# Patient Record
Sex: Male | Born: 1990
Health system: Southern US, Community
[De-identification: ages and names within clinical notes are randomized; demographics above are authoritative.]

## PROBLEM LIST (undated history)

## (undated) DIAGNOSIS — Z8249 Family history of ischemic heart disease and other diseases of the circulatory system: Secondary | ICD-10-CM

## (undated) DIAGNOSIS — H3320 Serous retinal detachment, unspecified eye: Secondary | ICD-10-CM

## (undated) DIAGNOSIS — L729 Follicular cyst of the skin and subcutaneous tissue, unspecified: Secondary | ICD-10-CM

## (undated) DIAGNOSIS — Z8719 Personal history of other diseases of the digestive system: Secondary | ICD-10-CM

## (undated) DIAGNOSIS — T7840XA Allergy, unspecified, initial encounter: Secondary | ICD-10-CM

## (undated) DIAGNOSIS — J45909 Unspecified asthma, uncomplicated: Secondary | ICD-10-CM

## (undated) DIAGNOSIS — Z9889 Other specified postprocedural states: Secondary | ICD-10-CM

## (undated) HISTORY — DX: Allergy, unspecified, initial encounter: T78.40XA

## (undated) HISTORY — DX: Personal history of other diseases of the digestive system: Z87.19

## (undated) HISTORY — DX: Other specified postprocedural states: Z98.890

## (undated) HISTORY — DX: Follicular cyst of the skin and subcutaneous tissue, unspecified: L72.9

## (undated) HISTORY — DX: Unspecified asthma, uncomplicated: J45.909

## (undated) HISTORY — DX: Family history of ischemic heart disease and other diseases of the circulatory system: Z82.49

## (undated) HISTORY — DX: Serous retinal detachment, unspecified eye: H33.20

---
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
## 2011-01-04 ENCOUNTER — Inpatient Hospital Stay (INDEPENDENT_AMBULATORY_CARE_PROVIDER_SITE_OTHER)
Admission: RE | Admit: 2011-01-04 | Discharge: 2011-01-04 | Disposition: A | Discharge status: Home / Self Care | Payer: Self-pay | Source: Ambulatory Visit | Attending: Family Medicine | Admitting: Family Medicine

## 2011-01-04 DIAGNOSIS — L02419 Cutaneous abscess of limb, unspecified: Secondary | ICD-10-CM

## 2011-01-04 DIAGNOSIS — L03119 Cellulitis of unspecified part of limb: Secondary | ICD-10-CM

## 2011-03-13 ENCOUNTER — Inpatient Hospital Stay (INDEPENDENT_AMBULATORY_CARE_PROVIDER_SITE_OTHER)
Admission: RE | Admit: 2011-03-13 | Discharge: 2011-03-13 | Disposition: A | Discharge status: Home / Self Care | Payer: Self-pay | Source: Ambulatory Visit | Attending: Family Medicine | Admitting: Family Medicine

## 2011-03-13 DIAGNOSIS — J45909 Unspecified asthma, uncomplicated: Secondary | ICD-10-CM

## 2011-03-13 DIAGNOSIS — B2790 Infectious mononucleosis, unspecified without complication: Secondary | ICD-10-CM

## 2011-04-11 NOTE — Op Note (Signed)
NAME:  DAESEAN, LAZARZ              ACCOUNT NO.:  [id number]   MEDICAL RECORD NO.:  [id number]          PATIENT TYPE:  AMB   LOCATION:  SDS                          FACILITY:  [facility]   PHYSICIAN:  Lennie Muckle, MD      DATE OF BIRTH:  1991-08-20   DATE OF PROCEDURE:  06/09/2008  DATE OF DISCHARGE:                               OPERATIVE REPORT   PREOPERATIVE DIAGNOSIS:  Posterior scalp cyst.   POSTERIOR DIAGNOSIS:  Posterior scalp cyst.   PROCEDURE:  Excision of cyst on scalp.   SURGEON:  Lennie Muckle, MD.   ANESTHESIA:  General endotracheal anesthesia.   FINDINGS:  A small cyst-like lesion on the posterior scalp, sent to  pathology.   COMPLICATIONS:  No immediate complications.   ESTIMATED BLOOD LOSS:  Minimal amount of blood loss.   INDICATIONS FOR PROCEDURE:  Mr. Gautreau is a pleasant 20 year old male  who had had a swelling on his posterior scalp for the past year on and  off, has gotten larger and spontaneously ruptures.  He has had multiple  problems with this.  He did have discomfort when it is swollen and when  he is in the lying position.  Informed consent was obtained for excision  under IV sedation.  However, due to the area and difficulty of  monitoring airway, we decided to place him under general endotracheal  anesthesia.   DETAILS OF PROCEDURE:  Mr. Wiegman was identified in the preoperative  holding area.  He was given a gram of Kefzol and was taken to the  operating room.  Once in the operating room, placed in the supine  position.  He was placed under general endotracheal anesthesia and  placed under prone position.  The lesion was identified by me in the  preoperative holding area and I had shaved around the vicinity of the  lesion.  Then, his head was prepped and draped in usual sterile fashion.  A time-out procedure indicating patient and procedure was performed.  I  anesthetized the skin with 0.25% Marcaine with epinephrine.  I then  placed an incision  directly over the lesion using a #15 blade.  I  identified the area of concern just using a electrocautery.  I then  irrigated the tissues with saline.  There was no evidence of bleeding at  the end of the case.  I closed it in interrupted fashion using 3-0  Vicryl suture and then a 4-0 Monocryl to close the skin.  Dermabond was  placed for final dressing.  The patient was then  extubated and transported to postanesthetic care unit in stable  condition.  He will be discharged home, can shower tomorrow, will be  given over-the-counter Tylenol and ibuprofen for pain.  Followup with me  in approximately 2 or 3 weeks.      Lennie Muckle, MD  Electronically Signed     ALA/MEDQ  D:  06/09/2008  T:  06/10/2008  Job:  [id number]   cc:   Ernestina Penna, M.D.

## 2011-04-14 NOTE — Discharge Summary (Signed)
NAME:  Darrell Erickson, Darrell Erickson NO.:  [id number]   MEDICAL RECORD NO.:  [id number]          PATIENT TYPE:  OBV   LOCATION:  6148                         FACILITY:  [facility]   PHYSICIAN:  Leighton Roach McDiarmid, M.D.DATE OF BIRTH:  Jul 06, 1991   DATE OF ADMISSION:  04/27/2006  DATE OF DISCHARGE:  04/29/2006                                 DISCHARGE SUMMARY   DISCHARGE MEDICATIONS:  1.  Advair 250/50 one puff inhaled b.i.d.  2.  Albuterol MDI 2 puffs q.4-6 h p.r.n. asthma.  3.  Singulair 10 mg one tab p.o. daily.  4.  Zyrtec 50 mg one tab p.o. daily.  5.  Prednisone 20 mg two tablets p.o. daily for five days (start on April 30, 2006).   DISCHARGE DIAGNOSIS:  Asthma exacerbation.   BRIEF HISTORY OF ADMISSION/LABORATORY DATA:  A 20 year old African-American  male with history of asthma  (poor compliance to treatment/inadequate home  regimen) came to the emergency room at [facility] on April 27, 2006 with acute  asthma exacerbation.  The patient presented with increased work of  breathing, retractions, nasal flaring and O2 saturations on room air up to  the 70s.  During the past 3 hours and a half during the visit in the  emergency room, the patient received continuous albuterol nebs, terbutaline  subcutaneous x1, Solu-Medrol 125 mg IV x1.  The patient had a recent history  of fever with cough with whitish yellow sputum for 2-3 days.  Also, the  patient was complaining of sore throat intermittently.  The patient has no  sick contacts at home.  Last admission for asthma exacerbation was in the  1990s.   PHYSICAL EXAM:  On day of admission, pulse was 138, blood pressure 140s/80s,  respiration rate 24, O2 saturation 92% on 11 liters of oxygen nasal cannula,  temperature 98.2.  In general, the patient was  thin, mildly distressed,  conversant but uncooperative.  On respiratory exam, the patient presented  mild retractions throughout, good air movement, positive inspiration and  expiration wheezing bilaterally, left greater than right.   LABORATORY DATA ON ADMISSION:  WBC 10.9.  Electrolytes within normal limits.  A chest x-ray showed hyperinflation, increased peribronchial markings  bilaterally, patchy infiltrates left lower lobe, no atelectasis.   The patient was admitted for status asthmaticus probably secondary to viral  upper respiratory infection.  The patient was admitted to the PICU.   HOSPITAL COURSE:  First, status asthmaticus:  The patient was admitted to  the PICU.  The patient received treatment with continuous nebulization of  albuterol, Solu-Medrol, oxygen to keep his O2 saturation equal to 92% and  follow up with chest x-ray to rule out pneumonia.  The patient was also  placed on p.o. home medications (see discharge medications).  Throughout the  hospitalization, the patient became clinically stable.  The patient was  transferred to regular floor on April 28, 2006.  The patient was treated with  albuterol/Atrovent nebulizations every 4 hours.  O2 saturations were stable  95% on room air.  The day of discharge, the patient is playing  in the  playroom.  O2 saturations are 95-97% on room air.  The patient is now  requiring nebulization treatments p.r.n., and presented with normal  respiratory sounds with no wheezing, no increased work of breathing and  required albuterol every 4 hours.  Since the patient was clinically stable,  the patient was discharged home.  Changes to home medications were done to  Advair that was increased to 200/50.  The patient is to follow up with  primary physician at [facility] in 1-2 weeks. Prescription  was written for this patient.  Parents were explained to take Darrell Erickson to  emergency room/primary physician office if increased work of breathing,  increased shortness of breath, requiring albuterol more often than every 4  hours without good response.  The patient understands instructions.  This  patient  has, although, history of noncompliance with treatment.      Henri Medal, MD    ______________________________  Etta Grandchild, M.D.    FIM/MEDQ  D:  04/29/2006  T:  04/30/2006  Job:  [id number]   cc:   Olena Leatherwood [facility]

## 2011-08-24 LAB — CBC
Hemoglobin: 14.7
MCHC: 33.7
Platelets: 164
RBC: 5.22
RDW: 13.4
WBC: 6.3

## 2011-08-24 LAB — POCT RAPID STREP A: Streptococcus, Group A Screen (Direct): NEGATIVE

## 2014-06-04 ENCOUNTER — Ambulatory Visit (INDEPENDENT_AMBULATORY_CARE_PROVIDER_SITE_OTHER): Payer: BC Managed Care – PPO | Admitting: Family Medicine

## 2014-06-04 VITALS — BP 132/70 | HR 88 | Temp 97.7°F | Resp 16 | Ht 71.0 in | Wt 130.0 lb

## 2014-06-04 DIAGNOSIS — H109 Unspecified conjunctivitis: Secondary | ICD-10-CM

## 2014-06-04 MED ORDER — OLOPATADINE HCL 0.2 % OP SOLN: 1.0000 [drp] | Freq: Every day | OPHTHALMIC | Status: DC

## 2014-06-04 NOTE — Patient Instructions (Signed)

## 2014-06-04 NOTE — Progress Notes (Signed)
   Subjective:    Patient ID: Darrell Erickson, male    DOB: 1991/10/04, 23 y.o.   MRN: [id number]  HPI Patient presents today with 3 days of red, right eye. Clear drainage through the day. Has a similar episode last month and used his mother's drops with full resolution. 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  He uses his albuterol inhaler 2-3x month  PMH-  Asthma, premature birth PSH- None SH- No tobacco, no illicit drugs, no ETOH   Review of Systems No foreign body sensation. No pain. No change in vision.     Objective:   Physical Exam  Vitals reviewed. Constitutional: He is oriented to person, place, and time. He appears well-developed and well-nourished.  HENT:  Head: Normocephalic and atraumatic.  Right Ear: External ear normal.  Left Ear: External ear normal.  Nose: Nose normal.  Mouth/Throat: Oropharynx is clear and moist.  Eyes: Right eye exhibits no discharge. Left eye exhibits no discharge. No scleral icterus.  Right pupil irregularly shaped, reactive to light, contact lens in place. Right conjunctiva mildly injected. 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 No drainage, no erythema, no edema.   Cardiovascular: Normal rate, regular rhythm and normal heart sounds.   Pulmonary/Chest: Effort normal and breath sounds normal.  Musculoskeletal: Normal range of motion.  Neurological: He is alert and oriented to person, place, and time.  Skin: Skin is warm and dry.  Psychiatric: He has a normal mood and affect. His behavior is normal. Judgment and thought content normal.       Assessment & Plan:  1. Conjunctivitis of right eye - Olopatadine HCl 0.2 % SOLN; Apply 1 drop to eye daily.  Dispense: 2.5 mL; Refill: 0 -discussed diagnosis and provided written and verbal instructions.  -discussed s/s  bacterial infection and patient to RTC if he develops any of these.  Emi Belfasteborah B. Gessner, FNP-BC  [facility]  06/05/2014 7:25 AM

## 2015-01-25 ENCOUNTER — Ambulatory Visit (INDEPENDENT_AMBULATORY_CARE_PROVIDER_SITE_OTHER): Payer: 59 | Admitting: Family Medicine

## 2015-01-25 ENCOUNTER — Ambulatory Visit: Payer: Self-pay

## 2015-01-25 VITALS — BP 152/62 | HR 84 | Temp 97.2°F | Resp 17 | Ht 71.5 in | Wt 141.8 lb

## 2015-01-25 DIAGNOSIS — Z113 Encounter for screening for infections with a predominantly sexual mode of transmission: Secondary | ICD-10-CM

## 2015-01-25 DIAGNOSIS — J302 Other seasonal allergic rhinitis: Secondary | ICD-10-CM

## 2015-01-25 DIAGNOSIS — N508 Other specified disorders of male genital organs: Secondary | ICD-10-CM

## 2015-01-25 DIAGNOSIS — R030 Elevated blood-pressure reading, without diagnosis of hypertension: Secondary | ICD-10-CM

## 2015-01-25 DIAGNOSIS — N5089 Other specified disorders of the male genital organs: Secondary | ICD-10-CM

## 2015-01-25 DIAGNOSIS — B070 Plantar wart: Secondary | ICD-10-CM

## 2015-01-25 DIAGNOSIS — J309 Allergic rhinitis, unspecified: Secondary | ICD-10-CM

## 2015-01-25 DIAGNOSIS — R062 Wheezing: Secondary | ICD-10-CM

## 2015-01-25 DIAGNOSIS — J4540 Moderate persistent asthma, uncomplicated: Secondary | ICD-10-CM

## 2015-01-25 LAB — POCT CBC
Granulocyte percent: 59.3 %G (ref 37–80)
HEMATOCRIT: 46.6 % (ref 43.5–53.7)
Hemoglobin: 15.2 g/dL (ref 14.1–18.1)
LYMPH, POC: 3.2 (ref 0.6–3.4)
MCH: 29.4 pg (ref 27–31.2)
MCHC: 32.7 g/dL (ref 31.8–35.4)
MCV: 89.9 fL (ref 80–97)
MID (cbc): 0.3 (ref 0–0.9)
MPV: 9 fL (ref 0–99.8)
POC GRANULOCYTE: 5.1 (ref 2–6.9)
POC LYMPH %: 37.7 % (ref 10–50)
POC MID %: 3 %M (ref 0–12)
Platelet Count, POC: 129 10*3/uL — AB (ref 142–424)
RBC: 5.18 M/uL (ref 4.69–6.13)
RDW, POC: 13.5 %
WBC: 8.6 10*3/uL (ref 4.6–10.2)

## 2015-01-25 LAB — POCT URINALYSIS DIPSTICK
BILIRUBIN UA: NEGATIVE
Blood, UA: NEGATIVE
GLUCOSE UA: NEGATIVE
KETONES UA: NEGATIVE
LEUKOCYTES UA: NEGATIVE
NITRITE UA: NEGATIVE
PH UA: 6
Spec Grav, UA: 1.025
Urobilinogen, UA: 1

## 2015-01-25 MED ORDER — IMIQUIMOD 5 % EX CREA: TOPICAL_CREAM | CUTANEOUS | Status: DC

## 2015-01-25 MED ORDER — FLUTICASONE PROPIONATE 50 MCG/ACT NA SUSP: 2.0000 | Freq: Every day | NASAL | Status: DC

## 2015-01-25 MED ORDER — ALBUTEROL SULFATE HFA 108 (90 BASE) MCG/ACT IN AERS: 2.0000 | INHALATION_SPRAY | Freq: Four times a day (QID) | RESPIRATORY_TRACT | Status: DC | PRN

## 2015-01-25 MED ORDER — BUDESONIDE-FORMOTEROL FUMARATE 80-4.5 MCG/ACT IN AERO: 2.0000 | INHALATION_SPRAY | Freq: Two times a day (BID) | RESPIRATORY_TRACT | Status: DC

## 2015-01-25 MED ORDER — CETIRIZINE HCL 10 MG PO TABS: 10.0000 mg | ORAL_TABLET | Freq: Every day | ORAL | Status: DC

## 2015-01-25 NOTE — Progress Notes (Signed)
MRN: [id number] DOB: 01/25/1991  Subjective:   Darrell Erickson is a 24 y.o. male presenting for chief complaint of Foot Growth; Groin sensitivity; and Medication Refill  Medication refill - reports albuterol inhaler use every other day for mild shob, intermittent wheezing, is having a harder time with weather change and dog at home. Has 2-3 night time episodes of shob. Had to use nebulizer ~2 months ago, has used it 2-3 times this past year. Smokes marijuana weekly ~1x, denies smoking cigarettes, rare alcohol use. Admits history of seasonal allergies in the past, used to use Zyrtec in the past. Also used to use Symbicort but admits that he stopped taking this, has not really followed up with his medical care due to poor insurance covera.   Foot growth - reports 2 month history of new onset lesion on plantar sole of his right foot. Lesion is painful, stingy, worse with stepping, bearing weight on right leg, exercise. Denies fevers, erythema, drainage, bleeding, edema. Patient is active, on his feet a lot, does MMA training.  Groin issue - reports 2 month history of new onset mass over left testicle. States that there is also another over right testicle. Found this incidentally while showering. Denies testicluar pain, genital rashes, fevers, penile discharge, hematuria, dysuria, pelvic pain, abdominal pain, n/v. Denies history of cryptorchidism. Denies history of sexually transmitted infection, is currently sexually active, always uses condom.  Denies any other aggravating or relieving factors, no other questions or concerns.  Darrell Erickson has a current medication list which includes the following prescription(s): albuterol. He is allergic to eggs or egg-derived products and shellfish allergy.  Darrell Erickson  has a past medical history of Asthma; Allergy; H/O hernia repair; Family history of heart murmur; Scalp cyst (2006); Retinal detachment; and Premature birth. Also  has past surgical history that  includes Cystectomy (2006).  ROS As in subjective.  Objective:   Vitals: BP 152/62 mmHg  Pulse 84  Temp(Src) 97.2 F (36.2 C) (Oral)  Resp 17  Ht 5' 11.5" (1.816 m)  Wt 141 lb 12.8 oz (64.32 kg)  BMI 19.50 kg/m2  SpO2 99%  Physical Exam  Constitutional: He is oriented to person, place, and time and well-developed, well-nourished, and in no distress.  HENT:  TM's intact bilaterally, no effusions or erythema. Nasal turbinates boggy and edematous R>L, clear-yellow rhinorrhea. Slight postnasal drip present, without oropharyngeal exudates or tonsillar edema or erythema.  Eyes: Conjunctivae are normal. Right eye exhibits no discharge. Left eye exhibits no discharge. No scleral icterus.  Cardiovascular: Normal rate, regular rhythm and intact distal pulses.  Exam reveals no gallop and no friction rub.   No murmur heard. Pulmonary/Chest: Effort normal. No stridor. No respiratory distress. He has wheezes (expiratory, diffuse over right side, also present in left mid lobe). He has no rales. He exhibits no tenderness.  Abdominal: Bowel sounds are normal.  Genitourinary: He exhibits abnormal scrotal mass (bilateral, mobile, over epidydimal area). He exhibits no abnormal testicular mass, no testicular tenderness, no scrotal tenderness and no epididymal tenderness. Cremasteric reflex is present. Penis exhibits no lesions and no edema. No discharge found.  Lymphadenopathy:    He has no cervical adenopathy.  Neurological: He is alert and oriented to person, place, and time.  Skin: Skin is warm and dry. Lesion (~2cm hyperkeratotic area over mid plantar surface of right foot with ~2 black dots centrally) noted. No abrasion, no bruising, no ecchymosis, no laceration, no petechiae and no rash noted. No erythema.  Psychiatric: Mood and  affect normal.   Results for orders placed or performed in visit on 01/25/15 (from the past 24 hour(s))  POCT urinalysis dipstick     Status: None   Collection Time:  01/25/15  3:27 PM  Result Value Ref Range   Color, UA amber    Clarity, UA clear    Glucose, UA neg    Bilirubin, UA neg    Ketones, UA neg    Spec Grav, UA 1.025    Blood, UA neg    pH, UA 6.0    Protein, UA trace    Urobilinogen, UA 1.0    Nitrite, UA neg    Leukocytes, UA Negative   POCT CBC     Status: Abnormal   Collection Time: 01/25/15  3:28 PM  Result Value Ref Range   WBC 8.6 4.6 - 10.2 K/uL   Lymph, poc 3.2 0.6 - 3.4   POC LYMPH PERCENT 37.7 10 - 50 %L   MID (cbc) 0.3 0 - 0.9   POC MID % 3.0 0 - 12 %M   POC Granulocyte 5.1 2 - 6.9   Granulocyte percent 59.3 37 - 80 %G   RBC 5.18 4.69 - 6.13 M/uL   Hemoglobin 15.2 14.1 - 18.1 g/dL   HCT, POC 16.1 09.6 - 53.7 %   MCV 89.9 80 - 97 fL   MCH, POC 29.4 27 - 31.2 pg   MCHC 32.7 31.8 - 35.4 g/dL   RDW, POC 04.5 %   Platelet Count, POC 129 (A) 142 - 424 K/uL   MPV 9.0 0 - 99.8 fL   Assessment and Plan :   1. Extrinsic asthma, moderate persistent, uncomplicated 2. Expiratory wheezing - Advised to start Symbicort for better asthma control, continue albuterol use as needed - Follow up in 4 weeks  3. Seasonal allergies 4. Allergic rhinitis, unspecified allergic rhinitis type - Start Flonase and Zyrtec for control of allergies - Advised to hydrate well, avoid allergens as much as possible - Follow up as above  5. Screen for sexually transmitted diseases 6. Scrotal masses - Labs pending, referral for scrotal US - Low risk for malignancy, may be normal finding (epidydimis given physical exam findings)  7. Plantar wart of right foot - Started treatment with liquid nitrogen today, patient tolerated this procedure well - Apply Imiquimod cream 3 times a week - Follow up in 4 weeks  8. Elevated blood pressure reading (not hypertension) - Likely due to dehydration, patient admitted that he did not drink water all day as he normally does - Provided water while at the clinic, advised regular and adequate hydration - Will  recheck BP in 1 month  Darrell Bamberg, PA-C [facility] [phone] 01/25/2015 3:48 PM

## 2015-01-25 NOTE — Patient Instructions (Addendum)
Please return to the walk in clinic in 4 weeks for follow up on your asthma and plantar wart.  Asthma - start symbicort twice a day for 1 month, continue to use albuterol as needed but not less than 6 hours between each use. Allergies - please start Flonase and Zyrtec daily for better allergy control. Wart - make sure you remember to apply Imiquimod 3x a week, every other day.    Plantar Warts Warts are benign (noncancerous) growths of the outer skin layer. They can occur at any time in life but are most common during childhood and the teen years. Warts can occur on many skin surfaces of the body. When they occur on the underside (sole) of your foot they are called plantar warts. They often emerge in groups with several small warts encircling a larger growth. CAUSES  Human papillomavirus (HPV) is the cause of plantar warts. HPV attacks a break in the skin of the foot. Walking barefoot can lead to exposure to the wart virus. Plantar warts tend to develop over areas of pressure such as the heel and ball of the foot. Plantar warts often grow into the deeper layers of skin. They may spread to other areas of the sole but cannot spread to other areas of the body. SYMPTOMS  You may also notice a growth on the undersurface of your foot. The wart may grow directly into the sole of the foot, or rise above the surface of the skin on the sole of the foot, or both. They are most often flat from pressure. Warts generally do not cause itching but may cause pain in the area of the wart when you put weight on your foot. DIAGNOSIS  Diagnosis is made by physical examination. This means your caregiver discovers it while examining your foot.  TREATMENT  There are many ways to treat plantar warts. However, warts are very tough. Sometimes it is difficult to treat them so that they go away completely and do not grow back. Any treatment must be done regularly to work. If left untreated, most plantar warts will eventually  disappear over a period of one to two years. Treatments you can do at home include:  Putting duct tape over the top of the wart (occlusion) has been found to be effective over several months. The duct tape should be removed each night and reapplied until the wart has disappeared.  Placing over-the-counter medications on top of the wart to help kill the wart virus and remove the wart tissue (salicylic acid, cantharidin, and dichloroacetic acid) are useful. These are called keratolytic agents. These medications make the skin soft and gradually layers will shed away. These compounds are usually placed on the wart each night and then covered with a bandage. They are also available in premedicated bandage form. Avoid surrounding skin when applying these liquids as these medications can burn healthy skin. The treatment may take several months of nightly use to be effective.  Cryotherapy to freeze the wart has recently become available over-the-counter for children 4 years and older. This system makes use of a soft narrow applicator connected to a bottle of compressed cold liquid that is applied directly to the wart. This medication can burn healthy skin and should be used with caution.  As with all over-the-counter medications, read the directions carefully before use. Treatments generally done in your caregiver's office include:  Some aggressive treatments may cause discomfort, discoloration, and scarring of the surrounding skin. The risks and benefits of treatment should  be discussed with your caregiver.  Freezing the wart with liquid nitrogen (cryotherapy, see above).  Burning the wart with use of very high heat (cautery).  Injecting medication into the wart.  Surgically removing or laser treatment of the wart.  Your caregiver may refer you to a dermatologist for difficult to treat large-sized warts or large numbers of warts. HOME CARE INSTRUCTIONS   Soak the affected area in warm water. Dry  the area completely when you are done. Remove the top layer of softened skin, then apply the chosen topical medication and reapply a bandage.  Remove the bandage daily and file excess wart tissue (pumice stone works well for this purpose). Repeat the entire process daily or every other day for weeks until the plantar wart disappears.  Several brands of salicylic acid pads are available as over-the-counter remedies.  Pain can be relieved by wearing a donut bandage. This is a bandage with a hole in it. The bandage is put on with the hole over the wart. This helps take the pressure off the wart and gives pain relief. To help prevent plantar warts:  Wear shoes and socks and change them daily.  Keep feet clean and dry.  Check your feet and your children's feet regularly.  Avoid direct contact with warts on other people.  Have growths or changes on your skin checked by your caregiver. Document Released: 02/03/2004 Document Revised: 03/30/2014 Document Reviewed: 07/14/2009 [facility] Patient Information 2015 [place] [place]. This information is not intended to replace advice given to you by your health care provider. Make sure you discuss any questions you have with your health care provider.    Asthma Asthma is a recurring condition in which the airways tighten and narrow. Asthma can make it difficult to breathe. It can cause coughing, wheezing, and shortness of breath. Asthma episodes, also called asthma attacks, range from minor to life-threatening. Asthma cannot be cured, but medicines and lifestyle changes can help control it. CAUSES Asthma is believed to be caused by inherited (genetic) and environmental factors, but its exact cause is unknown. Asthma may be triggered by allergens, lung infections, or irritants in the air. Asthma triggers are different for each person. Common triggers include:   Animal dander.  Dust mites.  Cockroaches.  Pollen from trees or  grass.  Mold.  Smoke.  Air pollutants such as dust, household cleaners, hair sprays, aerosol sprays, paint fumes, strong chemicals, or strong odors.  Cold air, weather changes, and winds (which increase molds and pollens in the air).  Strong emotional expressions such as crying or laughing hard.  Stress.  Certain medicines (such as aspirin) or types of drugs (such as beta-blockers).  Sulfites in foods and drinks. Foods and drinks that may contain sulfites include dried fruit, potato chips, and sparkling grape juice.  Infections or inflammatory conditions such as the flu, a cold, or an inflammation of the nasal membranes (rhinitis).  Gastroesophageal reflux disease (GERD).  Exercise or strenuous activity. SYMPTOMS Symptoms may occur immediately after asthma is triggered or many hours later. Symptoms include:  Wheezing.  Excessive nighttime or early morning coughing.  Frequent or severe coughing with a common cold.  Chest tightness.  Shortness of breath. DIAGNOSIS  The diagnosis of asthma is made by a review of your medical history and a physical exam. Tests may also be performed. These may include:  Lung function studies. These tests show how much air you breathe in and out.  Allergy tests.  Imaging tests such as X-rays. TREATMENT  Asthma cannot be cured, but it can usually be controlled. Treatment involves identifying and avoiding your asthma triggers. It also involves medicines. There are 2 classes of medicine used for asthma treatment:   Controller medicines. These prevent asthma symptoms from occurring. They are usually taken every day.  Reliever or rescue medicines. These quickly relieve asthma symptoms. They are used as needed and provide short-term relief. Your health care provider will help you create an asthma action plan. An asthma action plan is a written plan for managing and treating your asthma attacks. It includes a list of your asthma triggers and how  they may be avoided. It also includes information on when medicines should be taken and when their dosage should be changed. An action plan may also involve the use of a device called a peak flow meter. A peak flow meter measures how well the lungs are working. It helps you monitor your condition. HOME CARE INSTRUCTIONS   Take medicines only as directed by your health care provider. Speak with your health care provider if you have questions about how or when to take the medicines.  Use a peak flow meter as directed by your health care provider. Record and keep track of readings.  Understand and use the action plan to help minimize or stop an asthma attack without needing to seek medical care.  Control your home environment in the following ways to help prevent asthma attacks:  Do not smoke. Avoid being exposed to secondhand smoke.  Change your heating and air conditioning filter regularly.  Limit your use of fireplaces and wood stoves.  Get rid of pests (such as roaches and mice) and their droppings.  Throw away plants if you see mold on them.  Clean your floors and dust regularly. Use unscented cleaning products.  Try to have someone else vacuum for you regularly. Stay out of rooms while they are being vacuumed and for a short while afterward. If you vacuum, use a dust mask from a hardware store, a double-layered or microfilter vacuum cleaner bag, or a vacuum cleaner with a HEPA filter.  Replace carpet with wood, tile, or vinyl flooring. Carpet can trap dander and dust.  Use allergy-proof pillows, mattress covers, and box spring covers.  Wash bed sheets and blankets every week in hot water and dry them in a dryer.  Use blankets that are made of polyester or cotton.  Clean bathrooms and kitchens with bleach. If possible, have someone repaint the walls in these rooms with mold-resistant paint. Keep out of the rooms that are being cleaned and painted.  Wash hands frequently. SEEK  MEDICAL CARE IF:   You have wheezing, shortness of breath, or a cough even if taking medicine to prevent attacks.  The colored mucus you cough up (sputum) is thicker than usual.  Your sputum changes from clear or white to yellow, green, gray, or bloody.  You have any problems that may be related to the medicines you are taking (such as a rash, itching, swelling, or trouble breathing).  You are using a reliever medicine more than 2-3 times per week.  Your peak flow is still at 50-79% of your personal best after following your action plan for 1 hour.  You have a fever. SEEK IMMEDIATE MEDICAL CARE IF:   You seem to be getting worse and are unresponsive to treatment during an asthma attack.  You are short of breath even at rest.  You get short of breath when doing very little physical activity.  You have difficulty eating, drinking, or talking due to asthma symptoms.  You develop chest pain.  You develop a fast heartbeat.  You have a bluish color to your lips or fingernails.  You are light-headed, dizzy, or faint.  Your peak flow is less than 50% of your personal best. MAKE SURE YOU:   Understand these instructions.  Will watch your condition.  Will get help right away if you are not doing well or get worse. Document Released: 11/13/2005 Document Revised: 03/30/2014 Document Reviewed: 06/12/2013 [facility] Patient Information 2015 [place], [place]. This information is not intended to replace advice given to you by your health care provider. Make sure you discuss any questions you have with your health care provider.

## 2015-01-26 LAB — GC/CHLAMYDIA PROBE AMP
CT PROBE, AMP APTIMA: NEGATIVE
GC PROBE AMP APTIMA: NEGATIVE

## 2015-02-02 ENCOUNTER — Other Ambulatory Visit: Payer: Self-pay

## 2015-02-03 ENCOUNTER — Other Ambulatory Visit: Payer: Self-pay

## 2015-04-05 ENCOUNTER — Ambulatory Visit (INDEPENDENT_AMBULATORY_CARE_PROVIDER_SITE_OTHER): Payer: 59 | Admitting: Emergency Medicine

## 2015-04-05 VITALS — BP 132/82 | HR 70 | Temp 97.7°F | Resp 20 | Ht 71.5 in | Wt 136.6 lb

## 2015-04-05 DIAGNOSIS — J309 Allergic rhinitis, unspecified: Secondary | ICD-10-CM

## 2015-04-05 DIAGNOSIS — Z2089 Contact with and (suspected) exposure to other communicable diseases: Secondary | ICD-10-CM

## 2015-04-05 DIAGNOSIS — J4540 Moderate persistent asthma, uncomplicated: Secondary | ICD-10-CM

## 2015-04-05 DIAGNOSIS — Z207 Contact with and (suspected) exposure to pediculosis, acariasis and other infestations: Secondary | ICD-10-CM

## 2015-04-05 MED ORDER — CETIRIZINE HCL 10 MG PO TABS: 10.0000 mg | ORAL_TABLET | Freq: Every day | ORAL | Status: DC

## 2015-04-05 MED ORDER — FLUTICASONE PROPIONATE 50 MCG/ACT NA SUSP: 2.0000 | Freq: Every day | NASAL | Status: DC

## 2015-04-05 MED ORDER — MONTELUKAST SODIUM 10 MG PO TABS: 10.0000 mg | ORAL_TABLET | Freq: Every day | ORAL | Status: DC

## 2015-04-05 MED ORDER — PERMETHRIN 5 % EX CREA: 1.0000 "application " | TOPICAL_CREAM | Freq: Once | CUTANEOUS | Status: DC

## 2015-04-05 MED ORDER — BUDESONIDE-FORMOTEROL FUMARATE 160-4.5 MCG/ACT IN AERO: 2.0000 | INHALATION_SPRAY | Freq: Two times a day (BID) | RESPIRATORY_TRACT | Status: DC

## 2015-04-05 MED ORDER — ALBUTEROL SULFATE HFA 108 (90 BASE) MCG/ACT IN AERS: 2.0000 | INHALATION_SPRAY | RESPIRATORY_TRACT | Status: DC | PRN

## 2015-04-05 NOTE — Patient Instructions (Signed)

## 2015-04-05 NOTE — Progress Notes (Signed)
[facility] [address], [place] [address] [phone]  Date:  04/05/2015   Name:  Darrell Erickson   DOB:  22-Jul-1991   MRN:  [id number]  PCP:  No PCP Per Patient    Chief Complaint: Asthma; possible scabies; and paper work   History of Present Illness:  Darrell Erickson is a 24 y.o. very pleasant male patient who presents with the following:  Patient has a long term history of asthma.  Was on symbicort but ran out. Now using MDI frequently and daily.  Says it is interfering with work due need to use his MDI and break from work No fever, chills, sputum production Needs refills on flonase On singulair as child and found it useful Concerned he may have been exposed to scabies Has a few pruritic lesions on arms Denies other complaint or health concern today.   There are no active problems to display for this patient.   Past Medical History  Diagnosis Date  . Asthma   . Allergy   . H/O hernia repair     As newborn  . Family history of heart murmur     childhood  . Scalp cyst 2006  . Retinal detachment     as a newborn  . Premature birth     Was born at ~24 weeks    Past Surgical History  Procedure Laterality Date  . Cystectomy  2006    scalp    History  Substance Use Topics  . Smoking status: Never Smoker   . Smokeless tobacco: Not on file  . Alcohol Use: Not on file    Family History  Problem Relation Age of Onset  . Bipolar disorder Mother   . Schizophrenia Mother   . Lupus Father   . Hypertension Paternal Uncle   . Hypertension Paternal Uncle     Allergies  Allergen Reactions  . Eggs Or Egg-Derived Products   . Shellfish Allergy Itching    Medication list has been reviewed and updated.  Current Outpatient Prescriptions on File Prior to Visit  Medication Sig Dispense Refill  . budesonide-formoterol (SYMBICORT) 80-4.5 MCG/ACT inhaler Inhale 2 puffs into the lungs 2 (two) times daily. 1 Inhaler 6  . cetirizine (ZYRTEC)  10 MG tablet Take 1 tablet (10 mg total) by mouth daily. 30 tablet 11  . fluticasone (FLONASE) 50 MCG/ACT nasal spray Place 2 sprays into both nostrils daily. 16 g 11  . imiquimod (ALDARA) 5 % cream Apply topically 3 (three) times a week. 12 each 1   No current facility-administered medications on file prior to visit.    Review of Systems:  Review of Systems  Constitutional: Negative for fever, chills and fatigue.  HENT: Negative for congestion, ear pain, hearing loss, postnasal drip, rhinorrhea and sinus pressure.   Eyes: Negative for discharge and redness.  Respiratory: Negative for cough, shortness of breath and wheezing.   Cardiovascular: Negative for chest pain and leg swelling.  Gastrointestinal: Negative for nausea, vomiting, abdominal pain, constipation and blood in stool.  Genitourinary: Negative for dysuria, urgency and frequency.  Musculoskeletal: Negative for neck stiffness.  Skin: Negative for rash.  Neurological: Negative for seizures, weakness and headaches.   Physical Examination: Filed Vitals:   04/05/15 0932  BP: 132/82  Pulse: 70  Temp: 97.7 F (36.5 C)  Resp: 20   Filed Vitals:   04/05/15 0932  Height: 5' 11.5" (1.816 m)  Weight: 136 lb 9.6 oz (61.961 kg)  Body mass index is 18.79 kg/(m^2). Ideal Body Weight: Weight in (lb) to have BMI = 25: 181.4  GEN: WDWN, NAD, Non-toxic, A & O x 3 HEENT: Atraumatic, Normocephalic. Neck supple. No masses, No LAD. Ears and Nose: No external deformity. CV: RRR, No M/G/R. No JVD. No thrill. No extra heart sounds. PULM: CTA B, no wheezes, crackles, rhonchi. No retractions. No resp. distress. No accessory muscle use. ABD: S, NT, ND, +BS. No rebound. No HSM. EXTR: No c/c/e NEURO Normal gait.  PSYCH: Normally interactive. Conversant. Not depressed or anxious appearing.  Calm demeanor.    Assessment and Plan: 1. Extrinsic asthma, moderate persistent, uncomplicated Add symbicort and singulair - albuterol (PROVENTIL  HFA;VENTOLIN HFA) 108 (90 BASE) MCG/ACT inhaler; Inhale 2 puffs into the lungs every 4 (four) hours as needed for wheezing or shortness of breath (cough, shortness of breath or wheezing.).  Dispense: 1 Inhaler; Refill: 12  2. Expiratory wheezing Add meds - albuterol (PROVENTIL HFA;VENTOLIN HFA) 108 (90 BASE) MCG/ACT inhaler; Inhale 2 puffs into the lungs every 4 (four) hours as needed for wheezing or shortness of breath (cough, shortness of breath or wheezing.).  Dispense: 1 Inhaler; Refill: 12  3. Scabies   Elimite  Signed Phillips OdorJeffery Tayt Moyers, MD

## 2015-04-12 ENCOUNTER — Telehealth: Payer: Self-pay

## 2015-04-12 NOTE — Telephone Encounter (Signed)
Found PE forms in the scanned box, [address] make a copy and [facility] [address] Called patient to let them know.

## 2015-04-12 NOTE — Telephone Encounter (Signed)
Patient is requesting copy of his [employer] PE forms to be mailed [address],  [place], [place]  [id number]  Please send via mail - needs by the 23rd.   [phone]

## 2015-09-20 NOTE — Telephone Encounter (Signed)
No msg °

## 2015-09-29 ENCOUNTER — Encounter: Payer: Self-pay | Admitting: Family Medicine

## 2015-09-29 ENCOUNTER — Ambulatory Visit (INDEPENDENT_AMBULATORY_CARE_PROVIDER_SITE_OTHER): Payer: 59 | Admitting: Family Medicine

## 2015-09-29 ENCOUNTER — Telehealth: Payer: Self-pay | Admitting: Family Medicine

## 2015-09-29 VITALS — BP 133/74 | HR 89 | Temp 98.0°F | Resp 16 | Ht 71.5 in | Wt 132.6 lb

## 2015-09-29 DIAGNOSIS — Z91018 Allergy to other foods: Secondary | ICD-10-CM | POA: Diagnosis not present

## 2015-09-29 DIAGNOSIS — B070 Plantar wart: Secondary | ICD-10-CM | POA: Diagnosis not present

## 2015-09-29 DIAGNOSIS — J4540 Moderate persistent asthma, uncomplicated: Secondary | ICD-10-CM | POA: Diagnosis not present

## 2015-09-29 MED ORDER — BUDESONIDE-FORMOTEROL FUMARATE 80-4.5 MCG/ACT IN AERO: 2.0000 | INHALATION_SPRAY | Freq: Two times a day (BID) | RESPIRATORY_TRACT | Status: DC

## 2015-09-29 NOTE — Progress Notes (Signed)
Subjective:    Patient ID: Darrell Erickson, male    DOB: Dec 05, 1990, 24 y.o.   MRN: [id number]  HPI This is a pleasant [age] male who presents today for follow up of asthma and plantar wart.   Asthma- He was taking symbicort with good results but ran out. He had a difficult time in the summer with increased wheezing and chest tightness and was using albuterol inhaler multiple times a day. Symptoms are better now, he is using albuterol every 2 days. He has dogs who sleep with him, and tries to wash bedding frequently, but is not willing to not let his dogs sleep with him. 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 He has had more difficulty with his breathing lately which affects his ability to do his job. He needs a break every couple of hours. He would like a form completed today allowing time to use his inhaler and recover as needed. He had a similar form completed in the spring. He has a history of food allergies and would like referral to allergy for updated testing.   He has a recurrent right foot plantar wart. He has not used anything on it recently. Has had cryotherapy x 1. He has manually debrided it himself. It is very painful and he is concerned about spreading the virus when he does martial arts.   Past Medical History  Diagnosis Date  . Asthma   . Allergy   . H/O hernia repair     As newborn  . Family history of heart murmur     childhood  . Scalp cyst 2006  . Retinal detachment     as a newborn  . Premature birth     Was born at ~24 weeks   Past Surgical History  Procedure Laterality Date  . Cystectomy  2006    scalp   Family History  Problem Relation Age of Onset  . Bipolar disorder Mother   . Schizophrenia Mother   . Lupus Father   . Hypertension Paternal Uncle   . Hypertension Paternal Uncle    Social History  Substance Use Topics  . Smoking status: Never Smoker   . Smokeless tobacco: None  . Alcohol Use: None   Review  of Systems No fever or chills, no sputum production and per HPI    Objective:   Physical Exam Physical Exam  Constitutional: Oriented to person, place, and time. He appears well-developed and well-nourished.  HENT:  Head: Normocephalic and atraumatic.  Eyes: Conjunctivae are normal.  Neck: Normal range of motion. Neck supple.  Cardiovascular: Normal rate, regular rhythm and normal heart sounds.  Pulmonary/Chest: Effort normal and breath sounds normal.  Musculoskeletal: Normal range of motion.  Neurological: Alert and oriented to person, place, and time.  Skin: Skin is warm and dry. Ball of right foot with approximately 1.5 cm thickened, raised area with small black flecks. Little callous build up.  Psychiatric: Normal mood and affect. Behavior is normal. Judgment and thought content normal.  Vitals reviewed. BP 133/74 mmHg  Pulse 89  Temp(Src) 98 F (36.7 C) (Oral)  Resp 16  Ht 5' 11.5" (1.816 m)  Wt 132 lb 9.6 oz (60.147 kg)  BMI 18.24 kg/m2     Assessment & Plan:  1. Extrinsic asthma, moderate persistent, uncomplicated - has long discussion with patient regarding disease process and importance of controlling inflammation.  - budesonide-formoterol (SYMBICORT) 80-4.5 MCG/ACT inhaler; Inhale 2 puffs into  the lungs 2 (two) times daily.  Dispense: 1 Inhaler; Refill: 6  2. Multiple food allergies - Ambulatory referral to Allergy  3. Plantar wart of right foot - Discussed treatment options- cryotherapy vs. Salicylic acid. Due to size and location, concerned about effect of cryotherapy on his ability to walk (he doesn't drive). He will try wart stick and covering until next visit when we will re-evaluate.   - follow up in 2-3 months, sooner if worsening of symptoms.    Olean Ree, FNP-BC  [facility], [facility]  10/01/2015 6:50 PM

## 2015-09-29 NOTE — Telephone Encounter (Signed)
Requested paperwork completed and mailed to patient. Copy kept for scanning.

## 2015-09-29 NOTE — Patient Instructions (Signed)
Get a wart stick Take your medication every day Get some more zytrect (allergies)- generic fine

## 2015-10-25 ENCOUNTER — Telehealth: Payer: Self-pay

## 2015-10-25 NOTE — Telephone Encounter (Signed)
Pt states his job accomodation note needs to be more speciific,it should say he is allowed 30-45 minutues extra for break time??   Best phone for pt is [phone]

## 2015-10-26 NOTE — Telephone Encounter (Signed)
The paperwork has been sent for scanning, so I don't have it to review. If I remember correctly I specified that he could have a 15 minute break for inhaler use. I think 30-45 minutes is an excessive amount to use his inhaler. He should be seeing improvement with consistent use of his symbicort. It is important that he follow up as discussed.

## 2015-10-27 NOTE — Telephone Encounter (Signed)
Spoke with pt, he needs the form to specially say 30-45 minutes per day needed in addition to breaks and lunch. He states he is feeling a little better from the Symbicort and I reminded him to follow up accordingly. Pt agreed. He is going to fax me an addendum to the form since the original went to scanning. Thanks Debbie.

## 2015-11-09 NOTE — Telephone Encounter (Signed)
Forms completed and given to [facility] Erickson.

## 2016-02-01 ENCOUNTER — Ambulatory Visit (INDEPENDENT_AMBULATORY_CARE_PROVIDER_SITE_OTHER): Payer: 59 | Admitting: Family Medicine

## 2016-02-01 VITALS — BP 132/88 | HR 74 | Temp 97.8°F | Resp 16 | Ht 72.0 in | Wt 140.0 lb

## 2016-02-01 DIAGNOSIS — F329 Major depressive disorder, single episode, unspecified: Secondary | ICD-10-CM | POA: Diagnosis not present

## 2016-02-01 DIAGNOSIS — J069 Acute upper respiratory infection, unspecified: Secondary | ICD-10-CM

## 2016-02-01 DIAGNOSIS — R4589 Other symptoms and signs involving emotional state: Secondary | ICD-10-CM

## 2016-02-01 NOTE — Progress Notes (Signed)
Subjective:    Patient ID: Darrell Erickson, male    DOB: [birth year], 25 y.o.   MRN: [id number]  HPI This is a pleasant 25 yo male who presents today with 1 week of muscle aches, sweats, headache, sore throat, occasional productive cough (white).  He has a history of asthma and reports compliance with medications until he ran out two days ago. He is picking up his refills today. Feels better today. No OTC medication. Smokes marijuana occasionally.   Has a family history of mental health issues (mother with bi-polar, schizophrenia). He has noticed more intermittent sadness over the last 6 months. Has a lot of stress with full time work, school, bills. Feels better when he exercises. Is working on getting more sleep. He is interested in talking to a therapist. Denies suicidal ideations. He is Buddhist and meditates twice a day.   Past Medical History  Diagnosis Date  . Asthma   . Allergy   . H/O hernia repair     As newborn  . Family history of heart murmur     childhood  . Scalp cyst 2006  . Retinal detachment     as a newborn  . Premature birth     Was born at ~24 weeks   Past Surgical History  Procedure Laterality Date  . Cystectomy  2006    scalp   Family History  Problem Relation Age of Onset  . Bipolar disorder Mother   . Schizophrenia Mother   . Lupus Father   . Hypertension Paternal Uncle   . Hypertension Paternal Uncle    Social History  Substance Use Topics  . Smoking status: Never Smoker   . Smokeless tobacco: None  . Alcohol Use: None    Review of Systems  Constitutional: Positive for fever and fatigue.  HENT: Positive for rhinorrhea.   Respiratory: Positive for cough. Negative for shortness of breath and wheezing.   Neurological: Positive for headaches.  Psychiatric/Behavioral: Positive for sleep disturbance (difficulty falling asleep) and dysphoric mood. Negative for suicidal ideas and self-injury.       Objective:   Physical Exam  Constitutional:  He is oriented to person, place, and time. He appears well-developed and well-nourished. No distress.  HENT:  Head: Normocephalic and atraumatic.  Right Ear: Tympanic membrane, external ear and ear canal normal.  Left Ear: Tympanic membrane, external ear and ear canal normal.  Nose: Rhinorrhea present.  Mouth/Throat: Oropharynx is clear and moist.  Eyes: Conjunctivae are normal.  Neck: Normal range of motion. Neck supple.  Cardiovascular: Normal rate, regular rhythm and normal heart sounds.   Pulmonary/Chest: Effort normal and breath sounds normal.  Musculoskeletal: Normal range of motion.  Neurological: He is alert and oriented to person, place, and time.  Skin: Skin is warm and dry. He is not diaphoretic.  Psychiatric: He has a normal mood and affect. His behavior is normal. Judgment and thought content normal.  Vitals reviewed.  BP 132/88 mmHg  Pulse 74  Temp(Src) 97.8 F (36.6 C)  Resp 16  Ht 6' (1.829 m)  Wt 140 lb (63.504 kg)  BMI 18.98 kg/m2  SpO2 99% Wt Readings from Last 3 Encounters:  02/01/16 140 lb (63.504 kg)  09/29/15 132 lb 9.6 oz (60.147 kg)  04/05/15 136 lb 9.6 oz (61.961 kg)      Assessment & Plan:  1. Acute upper respiratory infection - suspect viral illness, feeling better over last 24 hours - provided instructions for symptomatic treatment, RTC precautions  2.  Depressed mood - provided contact numbers for area therapists - encouraged regular sleep schedule, regular exercise, time management to decrease stress - RTC if worsening mood   Olean Reeeborah Gessner, FNP-BC  [facility]  02/01/2016 1:09 PM

## 2016-02-01 NOTE — Patient Instructions (Addendum)
For muscle aches, headache, sore throat you can take over the counter acetaminophen or ibuprofen as directed on the package For nasal congestion you can use Afrin nasal spray twice a day for up to 3 days, and /or sudafed, and/or saline nasal spray For cough you can use Delsym cough syrup Please come back to see us or go to the emergency department if you are not better in 5 to 7 days or if you develop fever over 101 for more than 48 hours or if you develop wheezing or shortness of breath.    [facility] [place] [phone]-[phone] Karmen BongoAaron Stewart- [phone] Heather Kitchen- [phone] Mike CrazeKarla Townsend- [phone] Shanon RosserBarbara Farran- [phone] Berniece AndreasJulie Whitt- [phone] Vernie AmmonsClaire Hubrich [phone] Marjie SkiffLauren Atkinson [phone] Salomon Fickerri Bauert [place]) [phone]

## 2016-06-19 ENCOUNTER — Telehealth: Payer: Self-pay

## 2016-06-19 DIAGNOSIS — J4540 Moderate persistent asthma, uncomplicated: Secondary | ICD-10-CM

## 2016-06-19 DIAGNOSIS — Z2089 Contact with and (suspected) exposure to other communicable diseases: Secondary | ICD-10-CM

## 2016-06-19 DIAGNOSIS — Z207 Contact with and (suspected) exposure to pediculosis, acariasis and other infestations: Secondary | ICD-10-CM

## 2016-06-19 NOTE — Telephone Encounter (Signed)
Out of his inhaler, faxed over on Sunday.  Needs ASAP  [phone]

## 2016-06-20 ENCOUNTER — Other Ambulatory Visit: Payer: Self-pay

## 2016-06-20 DIAGNOSIS — J309 Allergic rhinitis, unspecified: Secondary | ICD-10-CM

## 2016-06-20 MED ORDER — ALBUTEROL SULFATE HFA 108 (90 BASE) MCG/ACT IN AERS: 2.0000 | INHALATION_SPRAY | RESPIRATORY_TRACT | 0 refills | Status: DC | PRN

## 2016-06-20 MED ORDER — FLUTICASONE PROPIONATE 50 MCG/ACT NA SUSP: 2.0000 | Freq: Every day | NASAL | 2 refills | Status: DC

## 2016-06-20 NOTE — Telephone Encounter (Signed)
Rs sent.

## 2016-06-20 NOTE — Addendum Note (Signed)
Addended by: Cydney Ok on: 06/20/2016 12:33 PM   Modules accepted: Orders

## 2016-07-15 ENCOUNTER — Other Ambulatory Visit: Payer: Self-pay | Admitting: Physician Assistant

## 2016-07-15 DIAGNOSIS — Z2089 Contact with and (suspected) exposure to other communicable diseases: Secondary | ICD-10-CM

## 2016-07-15 DIAGNOSIS — Z207 Contact with and (suspected) exposure to pediculosis, acariasis and other infestations: Secondary | ICD-10-CM

## 2016-07-15 DIAGNOSIS — J4540 Moderate persistent asthma, uncomplicated: Secondary | ICD-10-CM

## 2016-10-28 ENCOUNTER — Other Ambulatory Visit: Payer: Self-pay | Admitting: Physician Assistant

## 2016-10-28 DIAGNOSIS — J4540 Moderate persistent asthma, uncomplicated: Secondary | ICD-10-CM

## 2016-10-28 DIAGNOSIS — Z207 Contact with and (suspected) exposure to pediculosis, acariasis and other infestations: Secondary | ICD-10-CM

## 2016-10-28 DIAGNOSIS — Z2089 Contact with and (suspected) exposure to other communicable diseases: Secondary | ICD-10-CM

## 2016-10-29 NOTE — Telephone Encounter (Signed)
01/2016 last ov needs ov 

## 2016-12-05 ENCOUNTER — Other Ambulatory Visit: Payer: Self-pay | Admitting: Physician Assistant

## 2016-12-05 DIAGNOSIS — J4540 Moderate persistent asthma, uncomplicated: Secondary | ICD-10-CM

## 2016-12-05 DIAGNOSIS — Z2089 Contact with and (suspected) exposure to other communicable diseases: Secondary | ICD-10-CM

## 2016-12-05 DIAGNOSIS — Z207 Contact with and (suspected) exposure to pediculosis, acariasis and other infestations: Secondary | ICD-10-CM

## 2017-06-18 ENCOUNTER — Telehealth: Payer: Self-pay | Admitting: General Practice

## 2017-06-18 NOTE — Telephone Encounter (Signed)
rx refill- inhalor/// pt is completely out of rx  [facility]am[facility] [address], [place]  [phone] please call pt has appmt for sat 7/28

## 2017-06-19 NOTE — Telephone Encounter (Signed)
pPatient called back stating that he needs the ventolin inhaler refilled.

## 2017-06-19 NOTE — Telephone Encounter (Signed)
Called, left message on AM to call back to let us know which inhaler the patient need.

## 2017-06-20 NOTE — Telephone Encounter (Signed)
Pt has appt scheduled for 7/28

## 2017-06-22 ENCOUNTER — Other Ambulatory Visit: Payer: Self-pay | Admitting: Physician Assistant

## 2017-06-22 DIAGNOSIS — Z207 Contact with and (suspected) exposure to pediculosis, acariasis and other infestations: Secondary | ICD-10-CM

## 2017-06-22 DIAGNOSIS — Z2089 Contact with and (suspected) exposure to other communicable diseases: Secondary | ICD-10-CM

## 2017-06-22 DIAGNOSIS — J4540 Moderate persistent asthma, uncomplicated: Secondary | ICD-10-CM

## 2017-06-23 ENCOUNTER — Ambulatory Visit: Payer: Self-pay | Admitting: Physician Assistant

## 2018-02-28 ENCOUNTER — Encounter: Payer: Self-pay | Admitting: Physician Assistant

## 2018-11-29 DIAGNOSIS — D696 Thrombocytopenia, unspecified: Secondary | ICD-10-CM | POA: Insufficient documentation

## 2018-11-29 DIAGNOSIS — Z91018 Allergy to other foods: Secondary | ICD-10-CM | POA: Insufficient documentation

## 2018-11-29 DIAGNOSIS — R636 Underweight: Secondary | ICD-10-CM | POA: Insufficient documentation

## 2018-11-29 DIAGNOSIS — F319 Bipolar disorder, unspecified: Secondary | ICD-10-CM | POA: Insufficient documentation

## 2018-11-29 DIAGNOSIS — B070 Plantar wart: Secondary | ICD-10-CM | POA: Insufficient documentation

## 2018-11-29 DIAGNOSIS — J4520 Mild intermittent asthma, uncomplicated: Secondary | ICD-10-CM | POA: Insufficient documentation

## 2022-11-22 ENCOUNTER — Ambulatory Visit: Payer: 59 | Admitting: Family Medicine

## 2023-01-30 ENCOUNTER — Ambulatory Visit: Payer: Self-pay | Admitting: Medical

## 2023-02-08 ENCOUNTER — Ambulatory Visit: Payer: 59 | Admitting: Medical

## 2023-02-08 ENCOUNTER — Encounter: Payer: Self-pay | Admitting: Medical

## 2023-02-08 VITALS — BP 135/78 | HR 62 | Resp 18 | Ht 73.0 in | Wt 134.0 lb

## 2023-02-08 DIAGNOSIS — R768 Other specified abnormal immunological findings in serum: Secondary | ICD-10-CM

## 2023-02-08 DIAGNOSIS — M2550 Pain in unspecified joint: Secondary | ICD-10-CM

## 2023-02-08 DIAGNOSIS — R350 Frequency of micturition: Secondary | ICD-10-CM

## 2023-02-08 DIAGNOSIS — K921 Melena: Secondary | ICD-10-CM

## 2023-02-08 DIAGNOSIS — F909 Attention-deficit hyperactivity disorder, unspecified type: Secondary | ICD-10-CM

## 2023-02-08 DIAGNOSIS — G4700 Insomnia, unspecified: Secondary | ICD-10-CM

## 2023-02-08 DIAGNOSIS — R7689 Other specified abnormal immunological findings in serum: Secondary | ICD-10-CM

## 2023-02-08 DIAGNOSIS — F319 Bipolar disorder, unspecified: Secondary | ICD-10-CM

## 2023-02-08 DIAGNOSIS — J4530 Mild persistent asthma, uncomplicated: Secondary | ICD-10-CM

## 2023-02-08 LAB — CBC WITH DIFFERENTIAL/PLATELET
Basophils Absolute: 0 10*3/uL (ref 0.0–0.1)
Basophils Relative: 0.8 % (ref 0.0–3.0)
Eosinophils Absolute: 0.3 10*3/uL (ref 0.0–0.7)
Eosinophils Relative: 5.8 % — ABNORMAL HIGH (ref 0.0–5.0)
HCT: 43.9 % (ref 39.0–52.0)
Hemoglobin: 15.1 g/dL (ref 13.0–17.0)
Lymphocytes Relative: 41 % (ref 12.0–46.0)
Lymphs Abs: 2.5 10*3/uL (ref 0.7–4.0)
MCHC: 34.4 g/dL (ref 30.0–36.0)
MCV: 91.2 fl (ref 78.0–100.0)
Monocytes Absolute: 0.5 10*3/uL (ref 0.1–1.0)
Monocytes Relative: 8.6 % (ref 3.0–12.0)
Neutro Abs: 2.6 10*3/uL (ref 1.4–7.7)
Neutrophils Relative %: 43.8 % (ref 43.0–77.0)
Platelets: 137 10*3/uL — ABNORMAL LOW (ref 150.0–400.0)
RBC: 4.81 Mil/uL (ref 4.22–5.81)
RDW: 13.2 % (ref 11.5–15.5)
WBC: 6 10*3/uL (ref 4.0–10.5)

## 2023-02-08 LAB — POC URINALSYSI DIPSTICK (AUTOMATED)
Bilirubin, UA: NEGATIVE
Blood, UA: NEGATIVE
Glucose, UA: NEGATIVE
Ketones, UA: NEGATIVE
Leukocytes, UA: NEGATIVE
Nitrite, UA: NEGATIVE
Protein, UA: NEGATIVE
Spec Grav, UA: <=1.005 — AB (ref 1.010–1.025)
Urobilinogen, UA: 0.2 E.U./dL
pH, UA: 6 (ref 5.0–8.0)

## 2023-02-08 LAB — C-REACTIVE PROTEIN: CRP: <1 mg/dL (ref 0.5–20.0)

## 2023-02-08 LAB — PSA: PSA: 0.63 ng/mL (ref 0.10–4.00)

## 2023-02-08 LAB — SEDIMENTATION RATE: Sed Rate: 1 mm/hr (ref 0–15)

## 2023-02-08 NOTE — Patient Instructions (Addendum)
1. Blood in stool Based on intermittent the various times of blood in toilet and on paper will refer to GI MD. - CBC w/Diff  2. Insomnia, unspecified type Follow advise/med given by psychatrist.  3. Attention deficit hyperactivity disorder (ADHD), unspecified ADHD type Continue vyvanse.  4. Bipolar 1 disorder (HCC) Continue prozac  5. Mild persistent asthma without complication  Continue albuterol as need. If frequency wheezing increases add on steroid inhaler.  Frequent urination intermittently. Poct ua today. Psa today.  End of visit noted to have arthralgias and his dad has lupus.  Added inflammatory labs including ANA.  Follow up in one moth for wellness exam. Fasting labs that day.

## 2023-02-08 NOTE — Progress Notes (Signed)
Subjective:    Patient ID: Darrell Erickson, male    DOB: [birth year], 32 y.o.   MRN: [id number]  HPI  Pt in for first time.  Pt works Financial trader for [employer]. No regular exercise. Smoke marijuana daily. 2 large bottle of beer a day recently. Pt not eating healthy per wife.  Some bright red blood in stool 2 weeks ago. None recently. But will occur sporadically. Pt thinks he bump around rectum 2 weeks ago. Went away one week ago. At the time felt bump had no itching or burning around rectum. States lump was pea sized lump rt of rectum.  No constipation. Pt states over past 2 months saw bright red blood in toilet water and at times on paper. Total about 10 times.  Pt has asthma- Pt twice a month will have to use for one night alone.  History of insominia- since youth per pt. Recently relying more on alcohol. Failed 10 mg melatonin. Pt thnks his pyschiatrist recently may have rx'd trazadone.  Pt states has bipolar- on prozac.  ADD- he is on vyvanse.  Patient has intermittent frequent urination.  No fevers, chills or sweats.  No perineum pain.  He expresses concern about his prostate since his granddad had prostate cancer.  His dad did not have prostate cancer.  Intermittent arthralgias but none today.  Dad had lupus and patient expresses concern that he might have lupus.   Review of Systems  Constitutional:  Negative for chills, fatigue, fever and unexpected weight change.  Respiratory:  Negative for cough, chest tightness, shortness of breath and wheezing.   Cardiovascular:  Negative for chest pain and palpitations.  Gastrointestinal:  Negative for abdominal pain, blood in stool, nausea and vomiting.       Some bright red blood in stool 2 weeks ago. None recently. But will occur sporadically. Pt thinks he bump around rectum 2 weeks ago. Went away one week ago. At the time felt bump had no itching or burning around rectum. States lump was pea sized lump rt of rectum.  No constipation.   Genitourinary:  Negative for dysuria, frequency and hematuria.  Musculoskeletal:  Negative for back pain and neck pain.  Neurological:  Negative for dizziness, syncope, weakness and light-headedness.  Hematological:  Negative for adenopathy. Does not bruise/bleed easily.  Psychiatric/Behavioral:  Positive for sleep disturbance.     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  Tobacco Use   Smoking status: Never   Smokeless tobacco: Not on file  Vaping Use   Vaping Use: Never used  Substance and Sexual Activity   Alcohol use: Yes    Alcohol/week: 2.0 standard drinks of alcohol    Types: 2 Cans of beer per week    Comment: recently 2 large bottle at night.   Drug use: Yes    Types: Marijuana  Sexual activity: Yes  Other Topics Concern   Not on file  Social History Narrative   Not on file   Social Determinants of Health   Financial Resource Strain: Not on file  Food Insecurity: Not on file  Transportation Needs: Not on file  Physical Activity: Not on file  Stress: Not on file  Social Connections: Not on file  Intimate Partner Violence: Not on file    Past Surgical History:  Procedure Laterality Date   CYSTECTOMY  2006   scalp    Family History  Problem Relation Age of Onset   Bipolar disorder Mother    Schizophrenia Mother    Lupus Father    Hypertension Paternal Uncle    Hypertension Paternal Uncle     Allergies  Allergen Reactions   Amoxicillin Hives and Rash   Egg-Derived Products     Shellfish Allergy Itching    Current Outpatient Medications on File Prior to Visit  Medication Sig Dispense Refill   albuterol (VENTOLIN HFA) 108 (90 Base) MCG/ACT inhaler Inhale into the lungs.     FLUoxetine (PROZAC) 40 MG capsule Take 40 mg by mouth daily.     VYVANSE 50 MG capsule Take 50 mg by mouth every morning.     No current facility-administered medications on file prior to visit.    BP 135/78   Pulse 62   Resp 18   Ht '6\' 1"'$  (1.854 m)   Wt 134 lb (60.8 kg)   SpO2 95%   BMI 17.68 kg/m        Objective:   Physical Exam  General Mental Status- Alert. General Appearance- Not in acute distress.   Skin General: Color- Normal Color. Moisture- Normal Moisture.  Neck Carotid Arteries- Normal color. Moisture- Normal Moisture. No carotid bruits. No JVD.  Chest and Lung Exam Auscultation: Breath Sounds:-Normal.  Cardiovascular Auscultation:Rythm- Regular. Murmurs & Other Heart Sounds:Auscultation of the heart reveals- No Murmurs.  Abdomen Inspection:-Inspeection Normal. Palpation/Percussion:Note:No mass. Palpation and Percussion of the abdomen reveal- Non Tender, Non Distended + BS, no rebound or guarding.    Neurologic Cranial Nerve exam:- CN III-XII intact(No nystagmus), symmetric smile. Strength:- 5/5 equal and symmetric strength both upper and lower extremities.   Rectum-on inspection no external hemorrhoids seen.  Normal sphincter tone and stool card negative for blood.  Portion of the prostate was normal smooth.     Assessment & Plan:   Patient Instructions  1. Blood in stool Based on intermittent the various times of blood in toilet and on paper will refer to GI MD. - CBC w/Diff  2. Insomnia, unspecified type Follow advise/med given by psychatrist.  3. Attention deficit hyperactivity disorder (ADHD), unspecified ADHD type Continue vyvanse.  4. Bipolar 1 disorder (HCC) Continue prozac  5. Mild persistent asthma without complication   Continue albuterol as need. If frequency wheezing increases add on steroid inhaler.  Follow up in one moth for wellness exam. Fasting labs that day.   Mackie Pai, PA-C

## 2023-02-11 LAB — ANA: Anti Nuclear Antibody (ANA): POSITIVE — AB

## 2023-02-11 LAB — ANTI-NUCLEAR AB-TITER (ANA TITER): ANA Titer 1: 1:40 {titer} — ABNORMAL HIGH

## 2023-02-11 LAB — RHEUMATOID FACTOR: Rheumatoid fact SerPl-aCnc: <14 IU/mL (ref <14)

## 2023-02-11 NOTE — Addendum Note (Signed)
Addended by: Anabel Halon on: 02/11/2023 07:46 PM   Modules accepted: Orders

## 2023-03-12 ENCOUNTER — Telehealth: Payer: Self-pay

## 2023-03-12 ENCOUNTER — Ambulatory Visit: Payer: 59 | Admitting: Medical

## 2023-03-12 NOTE — Telephone Encounter (Signed)
No show letter

## 2023-03-19 ENCOUNTER — Telehealth: Payer: Self-pay | Admitting: Medical

## 2023-03-19 MED ORDER — ALBUTEROL SULFATE HFA 108 (90 BASE) MCG/ACT IN AERS: 2.0000 | INHALATION_SPRAY | Freq: Four times a day (QID) | RESPIRATORY_TRACT | 0 refills | Status: DC | PRN

## 2023-03-19 NOTE — Telephone Encounter (Signed)
Prescription Request  03/19/2023  Is this a "Controlled Substance" medicine? Yes  LOV: 02/08/2023  What is the name of the medication or equipment?  VYVANSE 50 MG capsule [[id number]   albuterol (VENTOLIN HFA) 108 (90 Base) MCG/ACT inhaler   Have you contacted your pharmacy to request a refill? No   Which pharmacy would you like this sent to?    [facility]   [address]. [place], [place] [address]  [phone].  Patient notified that their request is being sent to the clinical staff for review and that they should receive a response within 2 business days.   Please advise at Mobile [phone] (mobile) Pt would like a call to advise when sent in as he has been without meds for a llittle bit and it's affecting him

## 2023-03-19 NOTE — Addendum Note (Signed)
Addended by: Gwenevere Abbot on: 03/19/2023 08:54 PM   Modules accepted: Orders

## 2023-03-20 NOTE — Telephone Encounter (Signed)
Pt called back and said his Psychiatrist just recently left the practice. And that he has to establish with another provider there in the office first and then he can get his meds. He wanted to see if you could refill in the meantime.

## 2023-03-20 NOTE — Telephone Encounter (Signed)
Pt called and lvm to return call 

## 2023-03-20 NOTE — Telephone Encounter (Signed)
See below

## 2023-03-21 ENCOUNTER — Encounter: Payer: Self-pay | Admitting: Nurse Practitioner

## 2023-03-21 NOTE — Telephone Encounter (Signed)
Pt called back to let us know he has an appt on 4.29.24 with his new psychiatrist.

## 2023-04-26 ENCOUNTER — Other Ambulatory Visit: Payer: Self-pay | Admitting: Medical

## 2023-05-21 NOTE — Progress Notes (Unsigned)
Primary GI: New  Assessment / Plan   32 y.o. yo male with a past medical history consisting of, but not necessarily limited to asthma, bipolar disorder, ADD      History of Present Illness   Chief Complaint:  blood in stool   Patient saw PP 3/14 with bright red blood in stool x 2 weeks. Hgb was normal at 15.1      Procedure risk assessment:  No history of CHF.  No supplemental 02 use at home.  Not a known difficult airway Anticoagulant:    Previous Endoscopies / Labs /  Imaging         Latest Ref Rng & Units 02/08/2023   11:10 AM 01/25/2015    3:28 PM 06/09/2008    7:00 AM  CBC  WBC 4.0 - 10.5 K/uL 6.0  8.6  6.3   Hemoglobin 13.0 - 17.0 g/dL 16.1  09.6  04.5   Hematocrit 39.0 - 52.0 % 43.9  46.6  43.6   Platelets 150.0 - 400.0 K/uL 137.0   164     No results found for: "LIPASE"     No data to display              DG Chest 2 View Clinical Data: Cough.  Fever.   CHEST - 2 VIEW   Comparison: 07/14   Findings: There is atelectasis/infiltrate in the right lower lobe anteriorly or right middle lobe posteriorly.  This is consistent with atelectatic pneumonia.  Left lung is clear.  No effusion. Heart size is normal.  Mediastinal contours normal.   IMPRESSION: Volume loss/infiltrate at the right base consistent with pneumonia.  Provider: Lianne Moris    Past Medical History:  Diagnosis Date   Allergy    Asthma    Family history of heart murmur    childhood   H/O hernia repair    As newborn   Premature birth    Was born at ~24 weeks   Retinal detachment    as a newborn   Scalp cyst 2006   Past Surgical History:  Procedure Laterality Date   CYSTECTOMY  2006   scalp   Family History  Problem Relation Age of Onset   Bipolar disorder Mother    Schizophrenia Mother    Lupus Father    Hypertension Paternal Uncle    Hypertension Paternal Uncle    Social History   Tobacco Use   Smoking status: Never  Vaping Use   Vaping Use:  Never used  Substance Use Topics   Alcohol use: Yes    Alcohol/week: 2.0 standard drinks of alcohol    Types: 2 Cans of beer per week    Comment: recently 2 large bottle at night.   Drug use: Yes    Types: Marijuana   Current Outpatient Medications  Medication Sig Dispense Refill   albuterol (VENTOLIN HFA) 108 (90 Base) MCG/ACT inhaler Inhale into the lungs.     albuterol (VENTOLIN HFA) 108 (90 Base) MCG/ACT inhaler TAKE 2 PUFFS BY MOUTH EVERY 6 HOURS AS NEEDED 8.5 each 0   FLUoxetine (PROZAC) 40 MG capsule Take 40 mg by mouth daily.     VYVANSE 50 MG capsule Take 50 mg by mouth every morning.     No current facility-administered medications for this visit.   Allergies  Allergen Reactions   Amoxicillin Hives and Rash   Egg-Derived Products    Shellfish Allergy Itching     Review of Systems: Positive for ***.  All  other systems reviewed and negative except where noted in HPI.   Wt Readings from Last 3 Encounters:  02/08/23 134 lb (60.8 kg)  02/01/16 140 lb (63.5 kg)  09/29/15 132 lb 9.6 oz (60.1 kg)    Physical Exam:  There were no vitals taken for this visit. Constitutional:  Pleasant, generally well appearing ***male in no acute distress. Psychiatric:  Normal mood and affect. Behavior is normal. EENT: Pupils normal.  Conjunctivae are normal. No scleral icterus. Neck supple.  Cardiovascular: Normal rate, regular rhythm.  Pulmonary/chest: Effort normal and breath sounds normal. No wheezing, rales or rhonchi. Abdominal: Soft, nondistended, nontender. Bowel sounds active throughout. There are no masses palpable. No hepatomegaly. Neurological: Alert and oriented to person place and time. Extremities: *** No edema Skin: Skin is warm and dry. No rashes noted.  Willette Cluster, NP  05/21/2023, 9:19 PM  Cc:  Referring Provider Saguier, Ramon Dredge, PA-C

## 2023-05-22 ENCOUNTER — Ambulatory Visit (INDEPENDENT_AMBULATORY_CARE_PROVIDER_SITE_OTHER): Payer: 59 | Admitting: Nurse Practitioner

## 2023-05-22 ENCOUNTER — Encounter: Payer: Self-pay | Admitting: Nurse Practitioner

## 2023-05-22 DIAGNOSIS — H548 Legal blindness, as defined in USA: Secondary | ICD-10-CM | POA: Insufficient documentation

## 2023-05-22 DIAGNOSIS — K625 Hemorrhage of anus and rectum: Secondary | ICD-10-CM | POA: Diagnosis not present

## 2023-05-22 DIAGNOSIS — K5900 Constipation, unspecified: Secondary | ICD-10-CM | POA: Diagnosis not present

## 2023-05-22 MED ORDER — CLENPIQ 10-3.5-12 MG-GM -GM/160ML PO SOLN: 1.0000 | ORAL | 0 refills | Status: DC

## 2023-05-22 NOTE — Patient Instructions (Addendum)
_______________________________________________________  If your blood pressure at your visit was 140/90 or greater, please contact your primary care physician to follow up on this. _______________________________________________________  If you are age 31 or younger, your body mass index should be between 19-25. Your Body mass index is 18.47 kg/m. If this is out of the aformentioned range listed, please consider follow up with your Primary Care Provider.  ________________________________________________________  The [facility] providers would like to encourage you to use [facility] to communicate with providers for non-urgent requests or questions.  Due to long hold times on the telephone, sending your provider a message by [facility] may be a faster and more efficient way to get a response.  Please allow 48 business hours for a response.  Please remember that this is for non-urgent requests.  _______________________________________________________  Bonita Quin have been scheduled for a colonoscopy. Please follow written instructions given to you at your visit today.  Please pick up your prep supplies at the pharmacy within the next 1-3 days. If you use inhalers (even only as needed), please bring them with you on the day of your procedure.  Due to recent changes in healthcare laws, you may see the results of your imaging and laboratory studies on MyChart before your provider has had a chance to review them.  We understand that in some cases there may be results that are confusing or concerning to you. Not all laboratory results come back in the same time frame and the provider may be waiting for multiple results in order to interpret others.  Please give [place] 48 hours in order for your provider to thoroughly review all the results before contacting the office for clarification of your results.   Things to help with constipation: - Drink 60 ounces of water daily  - Eat foods high in fiber. Examples include  whole wheat products (especially wheat bran), quinoa, brown rice, legumes, leafy greens like kale, almonds, walnuts, seeds, and fruits with edible skins like pears and apples. - Start a daily fiber supplement such as Metamucil. Follow instructions on container - Take 1-2 stool softeners ( Docusate Sodium) at bedtime if stools are hard - If no improvement with above over the next 1-2 weeks, can add a capful of miralax in 8 oz of water daily - DO NOT STRAIN to have a bowel movement. You can use glycerin suppositories as needed to make it easier to pass stool.  - During a bowel movement, sit straight up ( do no lean over your knees). If able, elevate feet 6-8 inches on a stool when having a bowel movement.   Thank you for entrusting me with your care and choosing [facility].  Gunnar Fusi, NP

## 2023-05-23 NOTE — Progress Notes (Signed)
Agree with assessment/plan.  Raj Rossi Silvestro, MD [facility] [phone]  

## 2023-05-29 ENCOUNTER — Encounter: Payer: Self-pay | Admitting: Gastroenterology

## 2023-06-05 ENCOUNTER — Ambulatory Visit: Payer: 59 | Attending: Internal Medicine | Admitting: Internal Medicine

## 2023-06-05 ENCOUNTER — Encounter: Payer: Self-pay | Admitting: Internal Medicine

## 2023-06-05 VITALS — BP 116/70 | HR 63 | Resp 12 | Ht 72.0 in | Wt 135.0 lb

## 2023-06-05 DIAGNOSIS — H548 Legal blindness, as defined in USA: Secondary | ICD-10-CM

## 2023-06-05 DIAGNOSIS — J45909 Unspecified asthma, uncomplicated: Secondary | ICD-10-CM | POA: Insufficient documentation

## 2023-06-05 DIAGNOSIS — R768 Other specified abnormal immunological findings in serum: Secondary | ICD-10-CM

## 2023-06-05 DIAGNOSIS — D696 Thrombocytopenia, unspecified: Secondary | ICD-10-CM

## 2023-06-05 DIAGNOSIS — B351 Tinea unguium: Secondary | ICD-10-CM | POA: Diagnosis not present

## 2023-06-05 DIAGNOSIS — H543 Unqualified visual loss, both eyes: Secondary | ICD-10-CM | POA: Insufficient documentation

## 2023-06-05 DIAGNOSIS — K625 Hemorrhage of anus and rectum: Secondary | ICD-10-CM

## 2023-06-05 LAB — CBC WITH DIFFERENTIAL/PLATELET
Absolute Monocytes: 741 cells/uL (ref 200–950)
Basophils Relative: 0.6 %
MCHC: 34 g/dL (ref 32.0–36.0)
MCV: 89.4 fL (ref 80.0–100.0)
Monocytes Relative: 9.5 %
Neutro Abs: 4009 cells/uL (ref 1500–7800)
Platelets: 175 10*3/uL (ref 140–400)
RBC: 5.27 10*6/uL (ref 4.20–5.80)
Total Lymphocyte: 35.5 %
WBC: 7.8 10*3/uL (ref 3.8–10.8)

## 2023-06-05 NOTE — Progress Notes (Signed)
Office Visit Note  Patient: Darrell Erickson             Date of Birth: 1991/03/05           MRN: [id number]             PCP: Esperanza Richters, PA-C Referring: Marisue Brooklyn Visit Date: 06/05/2023   Subjective:  New Patient (Initial Visit)   History of Present Illness: Darrell Erickson is a 32 y.o. male here for evaluation of positive ANA checked in association with skin rashes, fatigue, joint and muscle pains, persistent lymph node swelling.  He has a family history of lupus in his father who passed away about 5 or 6 years ago with cardiovascular complications and had 2 renal transplants for end-stage kidney disease.  He is not sure about the exact timeline of symptom onset.  Previously had somewhat poorly controlled depression and bipolar symptoms in the past couple years but more recently doing better after change in his psychiatrist.  Onset of the symptoms sometime in this past few years probably more within the last 1 or 2.  Feels a lot of stiffness and achiness affecting the joints and muscles without visible swelling.  No specific injuries associated with this.  He gets erythematous skin rashes describes these around the center of the face and on his torso intermittently.  Usually resolved without residual discoloration.  Also has trouble with persistently cold extremities and sees whitish color change in the hands and feet more severe in the feet.  He frequently has palpable lymph node swelling and tenderness under the armpits.  Does not notice any at the groin or sides of the neck these get worse whenever he is sick most recent serious infection with influenza A in January.  He gets infrequent ulcers usually on the inside of the cheek surface.  Reports receding hairline in past few years but no rash or patchy alopecia.  He occasionally notices weak urinary stream or incomplete voiding not associated with any pain or discoloration.  He has had some chronic constipation and bright red  blood per rectum suspicious for internal hemorrhoids saw gastroenterology and was recommended for colonoscopy. 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  Has history of heart murmur he recalls dating back to pediatric care does not see a cardiologist or have any abnormal findings reported recently.  Did experience a syncopal episode when donating plasma earlier this year which was new for him.   01/2023 ANA 1:40 cytoplasmic RF neg ESR 1 CRP <1  Activities of Daily Living:  Patient reports morning stiffness for 2 hours.   Patient Reports nocturnal pain.  Difficulty dressing/grooming: Denies Difficulty climbing stairs: Denies Difficulty getting out of chair: Denies Difficulty using hands for taps, buttons, cutlery, and/or writing: Denies  Review of Systems  Constitutional:  Positive for fatigue.  HENT:  Positive for mouth sores and mouth dryness.   Eyes:  Positive for dryness.  Respiratory:  Positive for shortness of breath.   Cardiovascular:  Positive for chest pain and palpitations.  Gastrointestinal:  Positive for blood in stool, constipation and diarrhea.  Endocrine: Positive for increased urination.  Genitourinary:  Negative for involuntary urination.  Musculoskeletal:  Positive for joint pain, joint pain, joint swelling, myalgias, muscle weakness, morning stiffness, muscle tenderness and myalgias. Negative for gait problem.  Skin:  Positive for color change, rash and  sensitivity to sunlight. Negative for hair loss.  Allergic/Immunologic: Negative for susceptible to infections.  Neurological:  Positive for dizziness and headaches.  Hematological:  Positive for swollen glands.  Psychiatric/Behavioral:  Positive for depressed mood and sleep disturbance. The patient is nervous/anxious.     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   Asthma 06/05/2023   Toenail fungus 06/05/2023   Positive ANA (antinuclear antibody) 06/05/2023   BRBPR (bright red blood per rectum) 06/05/2023   Legally blind in right eye, as defined in [place] 05/22/2023   Bipolar I disorder (HCC) 11/29/2018   Low weight for height 11/29/2018   Mild intermittent asthma 11/29/2018   Multiple food allergies 11/29/2018   Plantar wart, right foot 11/29/2018   Thrombocytopenia (HCC) 11/29/2018    Past Medical History:  Diagnosis Date   Allergy    Asthma    Family history of heart murmur    childhood   H/O hernia repair    As newborn   Premature birth    Was born at ~24 weeks   Retinal detachment    as a newborn   Scalp cyst 2006    Family History  Problem Relation Age of Onset   Bipolar disorder Mother    Schizophrenia Mother    Lupus Father    Hypertension Paternal Uncle    Colon cancer Paternal Uncle    Hypertension Paternal Uncle    Colon cancer Paternal Uncle    Stomach cancer Neg Hx    Rectal cancer Neg Hx    Liver cancer Neg Hx    Pancreatic cancer Neg Hx    Past Surgical History:  Procedure Laterality Date   CYSTECTOMY  2006   scalp   Social History   Social History Narrative   Not on file    There is no immunization history on file for this patient.   Objective: Vital Signs: BP 116/70 (BP Location: Right Arm, Patient Position: Sitting, Cuff Size: Normal)   Pulse 63   Resp 12   Ht 6' (1.829 m)   Wt 135 lb (61.2 kg)   BMI 18.31 kg/m    Physical Exam HENT:     Nose: Nose normal.     Mouth/Throat:     Mouth: Mucous membranes are moist.     Pharynx: Oropharynx is clear.  Cardiovascular:     Rate and Rhythm: Normal rate and regular rhythm.  Pulmonary:     Effort: Pulmonary effort is normal.     Breath sounds: Normal breath sounds.  Musculoskeletal:     Right lower leg: No edema.     Left lower leg: No edema.  Lymphadenopathy:     Cervical: No cervical adenopathy  (Left axillary lymph node palpable).  Skin:    Findings: No rash.     Comments: Nonspecific nailfold capillary changes No digital pitting Nail discoloration and thickening on left foot  Neurological:     Mental Status: He is alert.  Psychiatric:        Mood and Affect: Mood normal.      Musculoskeletal Exam:  Shoulders full ROM no tenderness or swelling Elbows full ROM no tenderness or swelling Wrists full ROM no tenderness or swelling Fingers full ROM no tenderness or swelling, arachnodactyly Knees full ROM no tenderness or swelling   Investigation: No additional findings.  Imaging: No results found.  Recent Labs: Lab Results  Component Value Date   WBC 7.8  06/05/2023   HGB 16.0 06/05/2023   PLT 175 06/05/2023   NA 142 06/05/2023   K 4.3 06/05/2023   CL 105 06/05/2023   CO2 32 06/05/2023   GLUCOSE 73 06/05/2023   BUN 10 06/05/2023   CREATININE 1.00 06/05/2023   BILITOT 0.7 06/05/2023   AST 13 06/05/2023   ALT 20 06/05/2023   PROT 6.8 06/05/2023   CALCIUM 9.8 06/05/2023    Speciality Comments: No specialty comments available.  Procedures:  No procedures performed Allergies: Shellfish-derived products, Amoxicillin, Egg-derived products, and Iodine   Assessment / Plan:     Visit Diagnoses: Positive ANA (antinuclear antibody) - Plan: RNP Antibody, Anti-Smith antibody, Sjogrens syndrome-A extractable nuclear antibody, Sjogrens syndrome-B extractable nuclear antibody, Anti-DNA antibody, double-stranded, C3 and C4, Chromatin (Nucleosomal) Antibody, Anti-scleroderma antibody, Protein / creatinine ratio, urine  Positive ANA with numerous symptoms and family history with father having systemic lupus with renal complication.  However he does not demonstrate specific clinical criteria on exam today.  Will check complete autoantibody panel also serum complements and screening for proteinuria.  Would have low threshold for diagnosis if there are specific serology otherwise  can monitor for now. 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  With complications of this.  No history of lens detachment reported or in the review documentation.  Would not be a good candidate for trial of hydroxychloroquine with existing visual field deficits.  Toenail fungus  BRBPR (bright red blood per rectum)  Bright red blood has seen GI thought most likely internal hemorrhoids.  Agree with plan for colonoscopy to evaluate further.  Thrombocytopenia (HCC) - Plan: CBC with Differential/Platelet, COMPLETE METABOLIC PANEL WITH GFR  Checking CBC and CMP baseline for monitoring with chronic thrombocytopenia ruling out renal disease and his baseline if needing to start long-term medication.  Orders: Orders Placed This Encounter  Procedures   RNP Antibody   Anti-Smith antibody   Sjogrens syndrome-A extractable nuclear antibody   Sjogrens syndrome-B extractable nuclear antibody   Anti-DNA antibody, double-stranded   C3 and C4   Chromatin (Nucleosomal) Antibody   Anti-scleroderma antibody   Protein / creatinine ratio, urine   CBC with Differential/Platelet   COMPLETE METABOLIC PANEL WITH GFR   COMPLETE METABOLIC PANEL WITH GFR   CBC with Differential/Platelet   No orders of the defined types were placed in this encounter.    Follow-Up Instructions: Return in about 4 weeks (around 07/03/2023) for New pt +ANA f/u 70mo.   Fuller Plan, MD  Note - This record has been created using [employer].  Chart creation errors have been sought, but may not always  have been located. Such creation errors do not reflect on  the standard of medical care.

## 2023-06-06 LAB — COMPLETE METABOLIC PANEL WITH GFR
AG Ratio: 2.2 (calc) (ref 1.0–2.5)
ALT: 20 U/L (ref 9–46)
AST: 13 U/L (ref 10–40)
Albumin: 4.7 g/dL (ref 3.6–5.1)
Alkaline phosphatase (APISO): 84 U/L (ref 36–130)
BUN: 10 mg/dL (ref 7–25)
CO2: 32 mmol/L (ref 20–32)
Calcium: 9.8 mg/dL (ref 8.6–10.3)
Chloride: 105 mmol/L (ref 98–110)
Creat: 1 mg/dL (ref 0.60–1.26)
Globulin: 2.1 g/dL (calc) (ref 1.9–3.7)
Glucose, Bld: 73 mg/dL (ref 65–99)
Potassium: 4.3 mmol/L (ref 3.5–5.3)
Sodium: 142 mmol/L (ref 135–146)
Total Bilirubin: 0.7 mg/dL (ref 0.2–1.2)
Total Protein: 6.8 g/dL (ref 6.1–8.1)
eGFR: 103 mL/min/{1.73_m2} (ref >=60)

## 2023-06-06 LAB — CBC WITH DIFFERENTIAL/PLATELET
Basophils Absolute: 47 cells/uL (ref 0–200)
Eosinophils Absolute: 234 cells/uL (ref 15–500)
Eosinophils Relative: 3 %
HCT: 47.1 % (ref 38.5–50.0)
Hemoglobin: 16 g/dL (ref 13.2–17.1)
Lymphs Abs: 2769 cells/uL (ref 850–3900)
MCH: 30.4 pg (ref 27.0–33.0)
MPV: 12.4 fL (ref 7.5–12.5)
Neutrophils Relative %: 51.4 %
RDW: 12 % (ref 11.0–15.0)

## 2023-06-06 LAB — ANTI-SMITH ANTIBODY: ENA SM Ab Ser-aCnc: <1 AI

## 2023-06-06 LAB — CHROMATIN (NUCLEOSOMAL) ANTIBODY: Chromatin (Nucleosomal) Antibody: <1 AI

## 2023-06-06 LAB — PROTEIN / CREATININE RATIO, URINE
Creatinine, Urine: 255 mg/dL (ref 20–320)
Protein/Creat Ratio: 78 mg/g creat (ref 25–148)
Protein/Creatinine Ratio: 0.078 mg/mg creat (ref 0.025–0.148)
Total Protein, Urine: 20 mg/dL (ref 5–25)

## 2023-06-06 LAB — SJOGRENS SYNDROME-B EXTRACTABLE NUCLEAR ANTIBODY: SSB (La) (ENA) Antibody, IgG: <1 AI

## 2023-06-06 LAB — SJOGRENS SYNDROME-A EXTRACTABLE NUCLEAR ANTIBODY: SSA (Ro) (ENA) Antibody, IgG: <1 AI

## 2023-06-06 LAB — ANTI-DNA ANTIBODY, DOUBLE-STRANDED: ds DNA Ab: 2 IU/mL

## 2023-06-06 LAB — RNP ANTIBODY: Ribonucleic Protein(ENA) Antibody, IgG: <1 AI

## 2023-06-06 LAB — ANTI-SCLERODERMA ANTIBODY: Scleroderma (Scl-70) (ENA) Antibody, IgG: <1 AI

## 2023-06-06 LAB — C3 AND C4
C3 Complement: 106 mg/dL (ref 82–185)
C4 Complement: 19 mg/dL (ref 15–53)

## 2023-06-13 ENCOUNTER — Encounter: Payer: 59 | Admitting: Gastroenterology

## 2023-06-17 ENCOUNTER — Other Ambulatory Visit: Payer: Self-pay | Admitting: Medical

## 2023-07-02 ENCOUNTER — Telehealth: Payer: Self-pay | Admitting: Internal Medicine

## 2023-07-02 NOTE — Telephone Encounter (Signed)
Patient contacted the office requesting a referral to see the Cardiologist  Patient's preferred area for referral is around [place] but [place] is fine

## 2023-07-10 ENCOUNTER — Encounter: Payer: Self-pay | Admitting: Internal Medicine

## 2023-07-10 ENCOUNTER — Ambulatory Visit: Payer: 59 | Attending: Internal Medicine | Admitting: Internal Medicine

## 2023-07-10 VITALS — BP 108/68 | HR 58 | Resp 12 | Ht 73.0 in | Wt 140.0 lb

## 2023-07-10 DIAGNOSIS — R55 Syncope and collapse: Secondary | ICD-10-CM

## 2023-07-10 DIAGNOSIS — R591 Generalized enlarged lymph nodes: Secondary | ICD-10-CM | POA: Diagnosis not present

## 2023-07-10 DIAGNOSIS — R768 Other specified abnormal immunological findings in serum: Secondary | ICD-10-CM | POA: Diagnosis not present

## 2023-07-10 NOTE — Progress Notes (Signed)
Office Visit Note  Patient: Darrell Erickson             Date of Birth: [birth year]           MRN: [id number]             PCP: Marisue Brooklyn Referring: Marisue Brooklyn Visit Date: 07/10/2023   Subjective:  Follow-up   History of Present Illness: Darrell Erickson is a 32 y.o. male here for follow up for positive ANA with persistent rashes, fatigue, and lymph node swelling.  Lab test at initial visit for specific antibody markers were all negative.  His symptoms remain mostly the same still has some persistent nonpainful axillary lymph node swelling.  New since last visit some skin rashes on the left upper arm and left upper back.  Says these come and go lasting a few days at a time itchy and raised at onset then they become flat and resolve after a few days.  No residual discoloration and nonpainful.  Does not take any medication for this.  Previous HPI 06/05/23 Darrell Erickson is a 32 y.o. male here for evaluation of positive ANA checked in association with skin rashes, fatigue, joint and muscle pains, persistent lymph node swelling.  He has a family history of lupus in his father who passed away about 5 or 6 years ago with cardiovascular complications and had 2 renal transplants for end-stage kidney disease.  He is not sure about the exact timeline of symptom onset.  Previously had somewhat poorly controlled depression and bipolar symptoms in the past couple years but more recently doing better after change in his psychiatrist.  Onset of the symptoms sometime in this past few years probably more within the last 1 or 2.  Feels a lot of stiffness and achiness affecting the joints and muscles without visible swelling.  No specific injuries associated with this.  He gets erythematous skin rashes describes these around the center of the face and on his torso intermittently.  Usually resolved without residual discoloration.  Also has trouble with persistently cold extremities and sees  whitish color change in the hands and feet more severe in the feet.  He frequently has palpable lymph node swelling and tenderness under the armpits.  Does not notice any at the groin or sides of the neck these get worse whenever he is sick most recent serious infection with influenza A in January.  He gets infrequent ulcers usually on the inside of the cheek surface.  Reports receding hairline in past few years but no rash or patchy alopecia.  He occasionally notices weak urinary stream or incomplete voiding not associated with any pain or discoloration.  He has had some chronic constipation and bright red blood per rectum suspicious for internal hemorrhoids saw gastroenterology and was recommended for colonoscopy. 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  Has history of heart murmur he recalls dating back to pediatric care does not see a cardiologist or have any abnormal findings reported recently.  Did experience a syncopal episode when donating plasma earlier this year which was new for him.     01/2023 ANA 1:40 cytoplasmic RF neg ESR 1 CRP <1   Review of Systems  Constitutional:  Positive for fatigue.  HENT:  Positive for mouth sores and mouth dryness.   Eyes:  Positive for dryness.  Respiratory:  Positive for shortness of breath.   Cardiovascular:  Positive for chest pain and palpitations.  Gastrointestinal:  Negative for blood in stool, constipation and diarrhea.  Endocrine: Negative for increased urination.  Genitourinary:  Negative for involuntary urination.  Musculoskeletal:  Positive for joint pain, joint pain, joint swelling, myalgias, muscle weakness, morning stiffness, muscle tenderness and myalgias. Negative for gait problem.  Skin:  Positive for color change, rash, hair loss and sensitivity to sunlight.  Allergic/Immunologic: Negative for  susceptible to infections.  Neurological:  Positive for dizziness and headaches.  Hematological:  Positive for swollen glands.  Psychiatric/Behavioral:  Positive for depressed mood and sleep disturbance. The patient is nervous/anxious.     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   Asthma 06/05/2023   Toenail fungus 06/05/2023   Positive ANA (antinuclear antibody) 06/05/2023   BRBPR (bright red blood per rectum) 06/05/2023   Legally blind in right eye, as defined in [place] 05/22/2023   Bipolar I disorder (HCC) 11/29/2018   Low weight for height 11/29/2018   Mild intermittent asthma 11/29/2018   Multiple food allergies 11/29/2018   Plantar wart, right foot 11/29/2018   Thrombocytopenia (HCC) 11/29/2018    Past Medical History:  Diagnosis Date   Allergy    Asthma    Family history of heart murmur    childhood   H/O hernia repair    As newborn   Premature birth    Was born at ~24 weeks   Retinal detachment    as a newborn   Scalp cyst 2006    Family History  Problem Relation Age of Onset   Bipolar disorder Mother    Schizophrenia Mother    Lupus Father    Hypertension Paternal Uncle    Colon cancer Paternal Uncle    Hypertension Paternal Uncle    Colon cancer Paternal Uncle    Stomach cancer Neg Hx    Rectal cancer Neg Hx    Liver cancer Neg Hx    Pancreatic cancer Neg Hx    Past Surgical History:  Procedure Laterality Date   CYSTECTOMY  2006   scalp   Social History   Social History Narrative   Not on file    There is no immunization history on file for this patient.   Objective: Vital Signs: BP 108/68 (BP Location: Left Arm, Patient Position: Sitting, Cuff Size: Normal)   Pulse (!) 58   Resp 12   Ht 6\' 1"  (1.854 m)   Wt 140 lb (63.5 kg)   BMI 18.47 kg/m    Physical Exam Eyes:     Conjunctiva/sclera: Conjunctivae normal.  Neck:      Comments: Nontender mobile axillary lymph nodes palpable Cardiovascular:     Rate and Rhythm: Normal rate and regular rhythm.  Pulmonary:     Effort: Pulmonary effort is normal.     Breath sounds: Normal breath sounds.  Musculoskeletal:     Right lower leg: No edema.     Left lower leg: No edema.  Lymphadenopathy:     Cervical: No cervical adenopathy.  Skin:    General: Skin is warm and dry.     Findings: Rash present.     Comments: Flat petechial skin rash in a linear distribution left upper arm and left upper back, no blanching, no induration  Neurological:     Mental Status: He is alert.  Psychiatric:  Mood and Affect: Mood normal.      Musculoskeletal Exam:  Shoulders full ROM no tenderness or swelling Elbows full ROM no tenderness or swelling Wrists full ROM no tenderness or swelling Fingers full ROM no tenderness or swelling, arachnodactyly Knees full ROM no tenderness or swelling Ankles full ROM no tenderness or swelling   Investigation: No additional findings.  Imaging: No results found.  Recent Labs: Lab Results  Component Value Date   WBC 7.8 06/05/2023   HGB 16.0 06/05/2023   PLT 175 06/05/2023   NA 142 06/05/2023   K 4.3 06/05/2023   CL 105 06/05/2023   CO2 32 06/05/2023   GLUCOSE 73 06/05/2023   BUN 10 06/05/2023   CREATININE 1.00 06/05/2023   BILITOT 0.7 06/05/2023   AST 13 06/05/2023   ALT 20 06/05/2023   PROT 6.8 06/05/2023   CALCIUM 9.8 06/05/2023    Speciality Comments: No specialty comments available.  Procedures:  No procedures performed Allergies: Shellfish-derived products, Amoxicillin, Egg-derived products, and Iodine   Assessment / Plan:     Visit Diagnoses: Positive ANA (antinuclear antibody) Lymphadenopathy  Reviewed his lab workup that was unremarkable with very low positive ANA titer nonspecific symptoms and negative antibodies this does not look suggestive for lupus.  I do not see any major red flags from exam or  labs I think okay to monitor for now.  If symptoms remain or worsen with still nonspecific findings could send for evaluation of persistent lymphadenopathy.  Syncope, unspecified syncope type - Plan: Ambulatory referral to Cardiology  Refer to cardiology his history is questionable for Marfan syndrome I believe would benefit from screening to rule out structural defect with history of heart murmur syncopal episode and sometimes reports a abnormal chest sensation may be palpitations.  Orders: Orders Placed This Encounter  Procedures   Ambulatory referral to Cardiology   No orders of the defined types were placed in this encounter.    Follow-Up Instructions: Return in about 5 months (around 12/10/2023) for +ANA/rash/lymphadenopathy f/u 4-48mos.   Fuller Plan, MD  Note - This record has been created using [employer].  Chart creation errors have been sought, but may not always  have been located. Such creation errors do not reflect on  the standard of medical care.

## 2023-08-16 ENCOUNTER — Other Ambulatory Visit: Payer: Self-pay | Admitting: Medical

## 2023-10-05 ENCOUNTER — Ambulatory Visit (HOSPITAL_BASED_OUTPATIENT_CLINIC_OR_DEPARTMENT_OTHER): Payer: 59 | Admitting: Cardiology

## 2023-10-05 ENCOUNTER — Encounter (HOSPITAL_BASED_OUTPATIENT_CLINIC_OR_DEPARTMENT_OTHER): Payer: Self-pay | Admitting: Cardiology

## 2023-10-05 VITALS — BP 116/58 | HR 71 | Ht 72.0 in | Wt 138.0 lb

## 2023-10-05 DIAGNOSIS — Z7189 Other specified counseling: Secondary | ICD-10-CM

## 2023-10-05 DIAGNOSIS — R011 Cardiac murmur, unspecified: Secondary | ICD-10-CM

## 2023-10-05 DIAGNOSIS — R55 Syncope and collapse: Secondary | ICD-10-CM | POA: Diagnosis not present

## 2023-10-05 NOTE — Progress Notes (Signed)
Cardiology Office Note:  .   Date:  10/05/2023  ID:  Glennon Hamilton, DOB [birth year], MRN [id number] PCP: Marisue Brooklyn  [facility] Providers Cardiologist:  Jodelle Red, MD {  History of Present Illness: .   Darrell Erickson is a 32 y.o. male with PMH asthma, positive ANA who is seen in consultation for syncope. Reviewed note from Dr. Dimple Casey for syncope, history of murmur, palpitations, question Marfan's.  Had one day (two episodes) of syncope the evening after donating plasma. Felt lightheaded, recovered rapidly. EMS came, ECG normal. Had another presyncopal episode after donating plasma, went away with elevating legs.   Had a murmur as a child.   Main symptom right now is fatigue. Has had intermittent rashes, hand/finger cold sensation. Can't gain weight. Was told he had positive ANA, enlarged lymph nodes, but then told he didn't have lupus.  Family history: dad had lupus, was on dialysis, had to have valve replaced. No known sudden cardiac death. No aortic aneurysm/dissection that he is aware of.  ROS: Denies chest pain, shortness of breath at rest or with normal exertion. No PND, orthopnea, LE edema or unexpected weight gain. No palpitations.   Studies Reviewed: Marland Kitchen    EKG:  EKG Interpretation Date/Time:  Friday October 05 2023 14:56:49 EST Ventricular Rate:  71 PR Interval:  160 QRS Duration:  88 QT Interval:  382 QTC Calculation: 415 R Axis:   70  Text Interpretation: Normal sinus rhythm with sinus arrhythmia Normal ECG Confirmed by Jodelle Red [phone]) on 10/05/2023 3:04:35 PM    Physical Exam:   VS:  BP (!) 116/58 (BP Location: Left Arm, Patient Position: Sitting, Cuff Size: Normal)   Pulse 71   Ht 6' (1.829 m)   Wt 138 lb (62.6 kg)   BMI 18.72 kg/m    Wt Readings from Last 3 Encounters:  10/05/23 138 lb (62.6 kg)  07/10/23 140 lb (63.5 kg)  06/05/23 135 lb (61.2 kg)    GEN: Well nourished, well developed in no acute  distress HEENT: Normal, moist mucous membranes NECK: No JVD CARDIAC: regular rhythm, normal S1 and S2, no rubs or gallops. Very soft systolic murmur. VASCULAR: Radial and DP pulses 2+ bilaterally. No carotid bruits RESPIRATORY:  Clear to auscultation without rales, wheezing or rhonchi  ABDOMEN: Soft, non-tender, non-distended MUSCULOSKELETAL:  Ambulates independently SKIN: Warm and dry, no edema NEUROLOGIC:  Alert and oriented x 3. No focal neuro deficits noted. PSYCHIATRIC:  Normal affect    ASSESSMENT AND PLAN: .    Syncope -two episodes in the same day, after donating plasma. Prodrome very consistent with vasovagal event -will get echo given syncope and concern for Marfan's by his rheumatologist  Murmur -very soft murmur  ?Concern for Marfan's -unclear to me if there are other factors concerning for marfans, but he does not have a family history of this. No ectopia lentis -echo above will exclude ascending aorta pathology, mitral valve pathology, and evaluation of the pulmonary artery  CV risk counseling and prevention -recommend heart healthy/Mediterranean diet, with whole grains, fruits, vegetable, fish, lean meats, nuts, and olive oil. Limit salt. -recommend moderate walking, 3-5 times/week for 30-50 minutes each session. Aim for at least 150 minutes.week. Goal should be pace of 3 miles/hours, or walking 1.5 miles in 30 minutes -recommend avoidance of tobacco products. Avoid excess alcohol. -ASCVD risk score: The ASCVD Risk score (Arnett DK, et al., 2019) failed to calculate for the following reasons:   The 2019 ASCVD risk score  is only valid for ages 57 to 38    Dispo: as needed  Signed, Jodelle Red, MD   Jodelle Red, MD, PhD, [facility]  [facility]    Heart & Vascular at [facility] at [facility] [address] [place], [place] [address] [phone]

## 2023-10-05 NOTE — Patient Instructions (Signed)
Medication Instructions:  Your physician recommends that you continue on your current medications as directed. Please refer to the Current Medication list given to you today.   Labwork: NONE  Testing/Procedures: Your physician has requested that you have an echocardiogram. Echocardiography is a painless test that uses sound waves to create images of your heart. It provides your doctor with information about the size and shape of your heart and how well your heart's chambers and valves are working. This procedure takes approximately one hour. There are no restrictions for this procedure. Please do NOT wear cologne, perfume, aftershave, or lotions (deodorant is allowed). Please arrive 15 minutes prior to your appointment time.  Please note: We ask at that you not bring children with you during ultrasound (echo/ vascular) testing. Due to room size and safety concerns, children are not allowed in the ultrasound rooms during exams. Our front office staff cannot provide observation of children in our lobby area while testing is being conducted. An adult accompanying a patient to their appointment will only be allowed in the ultrasound room at the discretion of the ultrasound technician under special circumstances. We apologize for any inconvenience.  Follow-Up: AS NEEDED

## 2023-10-31 ENCOUNTER — Other Ambulatory Visit (HOSPITAL_BASED_OUTPATIENT_CLINIC_OR_DEPARTMENT_OTHER): Payer: Medicaid Other

## 2023-11-02 ENCOUNTER — Other Ambulatory Visit: Payer: Self-pay

## 2023-11-02 MED ORDER — VENTOLIN HFA 108 (90 BASE) MCG/ACT IN AERS: 2.0000 | INHALATION_SPRAY | Freq: Four times a day (QID) | RESPIRATORY_TRACT | 5 refills | Status: DC | PRN

## 2023-11-29 NOTE — Progress Notes (Deleted)
 Office Visit Note  Patient: Darrell Erickson             Date of Birth: 09-03-1991           MRN: [id number]             PCP: Francine Iron Referring: Francine Iron Visit Date: 12/12/2023   Subjective:  No chief complaint on file.   History of Present Illness: Darrell Erickson is a 33 y.o. male here for follow up for positive ANA with persistent rashes, fatigue, and lymph node swelling.    Previous HPI 07/10/2023 Darrell Erickson is a 33 y.o. male here for follow up for positive ANA with persistent rashes, fatigue, and lymph node swelling.  Lab test at initial visit for specific antibody markers were all negative.  His symptoms remain mostly the same still has some persistent nonpainful axillary lymph node swelling.  New since last visit some skin rashes on the left upper arm and left upper back.  Says these come and go lasting a few days at a time itchy and raised at onset then they become flat and resolve after a few days.  No residual discoloration and nonpainful.  Does not take any medication for this.   Previous HPI 06/05/23 Darrell Erickson is a 33 y.o. male here for evaluation of positive ANA checked in association with skin rashes, fatigue, joint and muscle pains, persistent lymph node swelling.  He has a family history of lupus in his father who passed away about 5 or 6 years ago with cardiovascular complications and had 2 renal transplants for end-stage kidney disease.  He is not sure about the exact timeline of symptom onset.  Previously had somewhat poorly controlled depression and bipolar symptoms in the past couple years but more recently doing better after change in his psychiatrist.  Onset of the symptoms sometime in this past few years probably more within the last 1 or 2.  Feels a lot of stiffness and achiness affecting the joints and muscles without visible swelling.  No specific injuries associated with this.  He gets erythematous skin rashes describes these  around the center of the face and on his torso intermittently.  Usually resolved without residual discoloration.  Also has trouble with persistently cold extremities and sees whitish color change in the hands and feet more severe in the feet.  He frequently has palpable lymph node swelling and tenderness under the armpits.  Does not notice any at the groin or sides of the neck these get worse whenever he is sick most recent serious infection with influenza A in January.  He gets infrequent ulcers usually on the inside of the cheek surface.  Reports receding hairline in past few years but no rash or patchy alopecia.  He occasionally notices weak urinary stream or incomplete voiding not associated with any pain or discoloration.  He has had some chronic constipation and bright red blood per rectum suspicious for internal hemorrhoids saw gastroenterology and was recommended for colonoscopy. 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  Has history of heart murmur he recalls dating back to pediatric care does not see a cardiologist or have any abnormal findings reported recently.  Did experience a syncopal episode when donating plasma earlier this year which was new for him.     01/2023 ANA 1:40 cytoplasmic RF neg ESR  1 CRP <1   No Rheumatology ROS completed.   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   Asthma 06/05/2023   Toenail fungus 06/05/2023   Positive ANA (antinuclear antibody) 06/05/2023   BRBPR (bright red blood per rectum) 06/05/2023   Legally blind in right eye, as defined in [place]  05/22/2023   Bipolar I disorder (HCC) 11/29/2018   Low weight for height 11/29/2018   Mild intermittent asthma 11/29/2018   Multiple food allergies 11/29/2018    Plantar wart, right foot 11/29/2018   Thrombocytopenia (HCC) 11/29/2018    Past Medical History:  Diagnosis Date   Allergy    Asthma    Family history of heart murmur    childhood   H/O hernia repair    As newborn   Premature birth    Was born at ~24 weeks   Retinal detachment    as a newborn   Scalp cyst 2006    Family History  Problem Relation Age of Onset   Bipolar disorder Mother    Schizophrenia Mother    Lupus Father    Hypertension Paternal Uncle    Colon cancer Paternal Uncle    Hypertension Paternal Uncle    Colon cancer Paternal Uncle    Stomach cancer Neg Hx    Rectal cancer Neg Hx    Liver cancer Neg Hx    Pancreatic cancer Neg Hx    Past Surgical History:  Procedure Laterality Date   CYSTECTOMY  2006   scalp   Social History   Social History Narrative   Not on file    There is no immunization history on file for this patient.   Objective: Vital Signs: There were no vitals taken for this visit.   Physical Exam   Musculoskeletal Exam: ***  CDAI Exam: CDAI Score: -- Patient Global: --; Provider Global: -- Swollen: --; Tender: -- Joint Exam 12/12/2023   No joint exam has been documented for this visit   There is currently no information documented on the homunculus. Go to the Rheumatology activity and complete the homunculus joint exam.  Investigation: No additional findings.  Imaging: No results found.  Recent Labs: Lab Results  Component Value Date   WBC 7.8 06/05/2023   HGB 16.0 06/05/2023   PLT 175 06/05/2023   NA 142 06/05/2023   K 4.3 06/05/2023   CL 105 06/05/2023   CO2 32 06/05/2023   GLUCOSE 73 06/05/2023   BUN 10 06/05/2023   CREATININE 1.00 06/05/2023   BILITOT 0.7 06/05/2023   AST 13 06/05/2023   ALT 20 06/05/2023   PROT 6.8 06/05/2023   CALCIUM 9.8 06/05/2023    Speciality Comments: No specialty comments available.  Procedures:  No procedures performed Allergies: Shellfish-derived products, Amoxicillin,  Egg-derived products, and Iodine   Assessment / Plan:     Visit Diagnoses: No diagnosis found.  ***  Orders: No orders of the defined types were placed in this encounter.  No orders of the defined types were placed in this encounter.    Follow-Up Instructions: No follow-ups on file.   Glena Landau, RT  Note - This record has been created using [employer].  Chart creation errors have been sought, but may not always  have been located. Such creation errors do not reflect on  the standard of medical care.

## 2023-12-12 ENCOUNTER — Encounter: Payer: Self-pay | Admitting: Medical

## 2023-12-12 ENCOUNTER — Ambulatory Visit: Payer: 59 | Admitting: Internal Medicine

## 2023-12-12 DIAGNOSIS — R591 Generalized enlarged lymph nodes: Secondary | ICD-10-CM

## 2023-12-12 DIAGNOSIS — R768 Other specified abnormal immunological findings in serum: Secondary | ICD-10-CM

## 2023-12-12 DIAGNOSIS — R55 Syncope and collapse: Secondary | ICD-10-CM

## 2023-12-18 ENCOUNTER — Ambulatory Visit: Payer: Medicaid Other | Admitting: Medical

## 2023-12-20 ENCOUNTER — Ambulatory Visit: Payer: Medicaid Other | Admitting: Medical

## 2023-12-21 ENCOUNTER — Other Ambulatory Visit (HOSPITAL_BASED_OUTPATIENT_CLINIC_OR_DEPARTMENT_OTHER): Payer: Medicaid Other

## 2023-12-25 ENCOUNTER — Ambulatory Visit: Payer: Medicaid Other | Admitting: Medical

## 2023-12-28 ENCOUNTER — Ambulatory Visit (INDEPENDENT_AMBULATORY_CARE_PROVIDER_SITE_OTHER): Payer: Medicaid Other | Admitting: Medical

## 2023-12-28 VITALS — BP 130/78 | HR 86 | Resp 18 | Ht 73.0 in | Wt 149.0 lb

## 2023-12-28 DIAGNOSIS — R5383 Other fatigue: Secondary | ICD-10-CM

## 2023-12-28 DIAGNOSIS — R768 Other specified abnormal immunological findings in serum: Secondary | ICD-10-CM | POA: Diagnosis not present

## 2023-12-28 DIAGNOSIS — F319 Bipolar disorder, unspecified: Secondary | ICD-10-CM

## 2023-12-28 DIAGNOSIS — E010 Iodine-deficiency related diffuse (endemic) goiter: Secondary | ICD-10-CM

## 2023-12-28 DIAGNOSIS — R591 Generalized enlarged lymph nodes: Secondary | ICD-10-CM | POA: Diagnosis not present

## 2023-12-28 DIAGNOSIS — F909 Attention-deficit hyperactivity disorder, unspecified type: Secondary | ICD-10-CM

## 2023-12-28 DIAGNOSIS — Z79899 Other long term (current) drug therapy: Secondary | ICD-10-CM

## 2023-12-28 LAB — CBC WITH DIFFERENTIAL/PLATELET
Basophils Absolute: 0 10*3/uL (ref 0.0–0.1)
Basophils Relative: 0.6 % (ref 0.0–3.0)
Eosinophils Absolute: 0.2 10*3/uL (ref 0.0–0.7)
Eosinophils Relative: 3.4 % (ref 0.0–5.0)
HCT: 43.8 % (ref 39.0–52.0)
Hemoglobin: 14.3 g/dL (ref 13.0–17.0)
Lymphocytes Relative: 35.9 % (ref 12.0–46.0)
Lymphs Abs: 2.1 10*3/uL (ref 0.7–4.0)
MCHC: 32.6 g/dL (ref 30.0–36.0)
MCV: 90.6 fL (ref 78.0–100.0)
Monocytes Absolute: 0.5 10*3/uL (ref 0.1–1.0)
Monocytes Relative: 7.7 % (ref 3.0–12.0)
Neutro Abs: 3.1 10*3/uL (ref 1.4–7.7)
Neutrophils Relative %: 52.4 % (ref 43.0–77.0)
Platelets: 136 10*3/uL — ABNORMAL LOW (ref 150.0–400.0)
RBC: 4.83 Mil/uL (ref 4.22–5.81)
RDW: 13.7 % (ref 11.5–15.5)
WBC: 6 10*3/uL (ref 4.0–10.5)

## 2023-12-28 LAB — IRON: Iron: 66 ug/dL (ref 42–165)

## 2023-12-28 LAB — COMPREHENSIVE METABOLIC PANEL
ALT: 19 U/L (ref 0–53)
AST: 14 U/L (ref 0–37)
Albumin: 4.5 g/dL (ref 3.5–5.2)
Alkaline Phosphatase: 74 U/L (ref 39–117)
BUN: 13 mg/dL (ref 6–23)
CO2: 29 meq/L (ref 19–32)
Calcium: 9 mg/dL (ref 8.4–10.5)
Chloride: 106 meq/L (ref 96–112)
Creatinine, Ser: 0.96 mg/dL (ref 0.40–1.50)
GFR: 104.93 mL/min (ref >60.00)
Glucose, Bld: 88 mg/dL (ref 70–99)
Potassium: 4.4 meq/L (ref 3.5–5.1)
Sodium: 142 meq/L (ref 135–145)
Total Bilirubin: 0.5 mg/dL (ref 0.2–1.2)
Total Protein: 6.3 g/dL (ref 6.0–8.3)

## 2023-12-28 LAB — VITAMIN B12: Vitamin B-12: 315 pg/mL (ref 211–911)

## 2023-12-28 LAB — T4, FREE: Free T4: 0.9 ng/dL (ref 0.60–1.60)

## 2023-12-28 LAB — TSH: TSH: 1.55 u[IU]/mL (ref 0.35–5.50)

## 2023-12-28 MED ORDER — EPINEPHRINE 0.3 MG/0.3ML IJ SOAJ: 0.3000 mg | INTRAMUSCULAR | 1 refills | Status: DC | PRN

## 2023-12-28 NOTE — Progress Notes (Signed)
Subjective:    Patient ID: Darrell Erickson, male    DOB: Nov 07, 1991, 33 y.o.   MRN: [id number]  Discussed the use of AI scribe software for clinical note transcription with the patient, who gave verbal consent to proceed.  History of Present Illness   The patient presents with a history of positive ANA and elevated titers for further evaluation of potential lupus. He was referred by a rheumatologist to a cardiologist for further evaluation.  The patient has a history of positive ANA and elevated titers, initially seeking medical attention last year due to concerns about lupus. He was referred to a rheumatologist who conducted initial lab tests and advised him to return during a flare-up for further testing. Upon returning during a flare-up, the rheumatologist did not repeat the labs and instead referred him to a cardiologist. The cardiologist found no cardiac issues but noted that the rheumatologist suspected a condition similar to what Darrell Erickson had. (pt offered echo by cardiologist but down played importance per pt. "He states would likely be normal so he declined.")  He experiences skin rashes on his face, particularly in the summer, described as a 'butterfly' rash on his cheeks. He has noted swollen lymph nodes under his arms, which have been present for a long time without pain. The rheumatologist previously noted these as swollen.  He experiences daily fatigue, which is significantly impacted if he does not take Vyvanse prescribed for ADHD. He continues to take Vyvanse for ADHD and has been off Vraylar for bipolar disorder due to a lapse in medication availability. He is scheduled to see a new psychiatrist in mid-February for further evaluation and management of his psychiatric conditions.  He has a history of low platelet counts and has been thin throughout his life, with his current weight being the highest at approximately 149 pounds. He has been attempting to gain weight through  increased protein intake and exercise.  He has a history of alcohol use, previously consuming two large bottles daily, but has significantly reduced intake, now only having an occasional drink. He has a history of blood in stools, which has resolved, and was attributed to a possible internal hemorrhoid by a GI specialist.  He has a shellfish allergy and has requested an EpiPen for precautionary measures, although he has never had to use one. He describes his allergic reactions as involving throat tightness and itchiness.         Review of Systems See hpi    Objective:   Physical Exam  General Mental Status- Alert. General Appearance- Not in acute distress.   Skin General: Color- Normal Color. Moisture- Normal Moisture.  Neck Carotid Arteries- Normal color. Moisture- Normal Moisture. No carotid bruits. No JVD.  Chest and Lung Exam Auscultation: Breath Sounds:-Normal.  Cardiovascular Auscultation:Rythm- Regular. Murmurs & Other Heart Sounds:Auscultation of the heart reveals- No Murmurs.  Abdomen Inspection:-Inspeection Normal. Palpation/Percussion:Note:No mass. Palpation and Percussion of the abdomen reveal- Non Tender, Non Distended + BS, no rebound or guarding.    Neurologic Cranial Nerve exam:- CN III-XII intact(No nystagmus), symmetric smile. Strength:- 5/5 equal and symmetric strength both upper and lower extremities.   Lymphatic exam.- no lymphagenopathy in heent area or supraclaviluar are but rt axilary small less than pea sized flat feeling lymph node. Left axillary area no lymph node felt.      Assessment & Plan:  Assessment and Plan    Possible Lupus Positive ANA, elevated titers, fatigue, and butterfly rash. Previous rheumatologist evaluation inconclusive. No recent lab  work to confirm diagnosis. -Refer to a new rheumatologist for a second opinion. -Order fatigue labs including metabolic panel, TSH, T4, iron, B12, B1. -repeat ana  Enlarged Lymph  Nodes Noted in axillary area, no other symptoms of infection or malignancy. -Order CBC to evaluate for any abnormalities. -Consider referral to a hematologist if CBC results are abnormal.  Enlarged Thyroid Noted on physical exam. -Order thyroid ultrasound for further evaluation.  ADHD Managed with Vyvanse, reports improved energy levels when taking medication. -Perform urine drug screen. -If only marijuana is detected, refill Vyvanse for 30 days until patient sees psychiatrist. Going foward psychiatrist will rx. If any other drugs show will not rx.  Shellfish Allergy History of anaphylactic reaction. -Prescribe EpiPen for emergency use in case of accidental exposure.  Bipolar Disorder Managed by psychiatrist, patient currently off Vraylar. -Defer to psychiatrist for management and further evaluation.  Alcohol Use Previously heavy use, now reports significant reduction and occasional use. -Encourage continued reduction/abstinence.   Fatigue -tsh, cbc,b12,b1, and iron level.   Thyromegaly on exam' -thyroid US.  Follow-up in 3 weeks to review lab and ultrasound results, and discuss referrals.      Time spent with patient today was  43 minutes which consisted of chart review, discussing diagnosis, work up treatment and documentation.

## 2023-12-28 NOTE — Patient Instructions (Signed)
Possible Lupus Positive ANA, elevated titers, fatigue, and butterfly rash. Previous rheumatologist evaluation inconclusive. No recent lab work to confirm diagnosis. -Refer to a new rheumatologist for a second opinion. -Order fatigue labs including metabolic panel, TSH, T4, iron, B12, B1. -repeat ana  Enlarged Lymph Nodes Noted in axillary area, no other symptoms of infection or malignancy. -Order CBC to evaluate for any abnormalities. -Consider referral to a hematologist if CBC results are abnormal.  Enlarged Thyroid Noted on physical exam. -Order thyroid ultrasound for further evaluation.  ADHD Managed with Vyvanse, reports improved energy levels when taking medication. -Perform urine drug screen. -If only marijuana is detected, refill Vyvanse for 30 days until patient sees psychiatrist. Going foward psychiatrist will rx. If any other drugs show will not rx.  Shellfish Allergy History of anaphylactic reaction. -Prescribe EpiPen for emergency use in case of accidental exposure.  Bipolar Disorder Managed by psychiatrist, patient currently off Vraylar. -Defer to psychiatrist for management and further evaluation.  Alcohol Use Previously heavy use, now reports significant reduction and occasional use. -Encourage continued reduction/abstinence.   Fatigue -tsh, cbc,b12,b1, and iron level.   Thyromegaly on exam' -thyroid US.  Follow-up in 3 weeks to review lab and ultrasound results, and discuss referrals.

## 2023-12-29 ENCOUNTER — Encounter: Payer: Self-pay | Admitting: Medical

## 2023-12-29 LAB — ANA: Anti Nuclear Antibody (ANA): NEGATIVE

## 2023-12-31 ENCOUNTER — Other Ambulatory Visit: Payer: Self-pay | Admitting: Medical

## 2023-12-31 ENCOUNTER — Other Ambulatory Visit: Payer: Medicaid Other

## 2023-12-31 LAB — DRUG MONITORING PANEL 376104, URINE
Amphetamine: 807 ng/mL — ABNORMAL HIGH (ref <250)
Amphetamines: POSITIVE ng/mL — AB (ref <500)
Barbiturates: NEGATIVE ng/mL (ref <300)
Benzodiazepines: NEGATIVE ng/mL (ref <100)
Cocaine Metabolite: NEGATIVE ng/mL (ref <150)
Desmethyltramadol: NEGATIVE ng/mL (ref <100)
Methamphetamine: NEGATIVE ng/mL (ref <250)
Opiates: NEGATIVE ng/mL (ref <100)
Oxycodone: NEGATIVE ng/mL (ref <100)
Tramadol: NEGATIVE ng/mL (ref <100)

## 2023-12-31 LAB — DM TEMPLATE

## 2023-12-31 MED ORDER — VYVANSE 50 MG PO CAPS: 50.0000 mg | ORAL_CAPSULE | Freq: Every morning | ORAL | 0 refills | Status: DC

## 2023-12-31 NOTE — Addendum Note (Signed)
Addended by: Gwenevere Abbot on: 12/31/2023 09:22 PM   Modules accepted: Orders

## 2023-12-31 NOTE — Telephone Encounter (Signed)
Pt requesting medications.

## 2024-01-02 ENCOUNTER — Other Ambulatory Visit (HOSPITAL_BASED_OUTPATIENT_CLINIC_OR_DEPARTMENT_OTHER): Payer: Self-pay

## 2024-01-03 LAB — VITAMIN B1: Vitamin B1 (Thiamine): 6 nmol/L — ABNORMAL LOW (ref 8–30)

## 2024-01-04 ENCOUNTER — Telehealth (HOSPITAL_BASED_OUTPATIENT_CLINIC_OR_DEPARTMENT_OTHER): Payer: Self-pay

## 2024-01-05 ENCOUNTER — Ambulatory Visit (HOSPITAL_BASED_OUTPATIENT_CLINIC_OR_DEPARTMENT_OTHER): Payer: Medicaid Other

## 2024-01-08 ENCOUNTER — Ambulatory Visit (HOSPITAL_BASED_OUTPATIENT_CLINIC_OR_DEPARTMENT_OTHER): Admission: RE | Admit: 2024-01-08 | Payer: Medicaid Other | Source: Ambulatory Visit

## 2024-01-10 ENCOUNTER — Ambulatory Visit (HOSPITAL_BASED_OUTPATIENT_CLINIC_OR_DEPARTMENT_OTHER)
Admission: RE | Admit: 2024-01-10 | Discharge: 2024-01-10 | Disposition: A | Discharge status: Home / Self Care | Payer: Medicaid Other | Source: Ambulatory Visit | Attending: Medical | Admitting: Medical

## 2024-01-10 DIAGNOSIS — E010 Iodine-deficiency related diffuse (endemic) goiter: Secondary | ICD-10-CM | POA: Insufficient documentation

## 2024-01-12 ENCOUNTER — Encounter: Payer: Self-pay | Admitting: Medical

## 2024-01-21 ENCOUNTER — Ambulatory Visit: Payer: Medicaid Other | Admitting: Medical

## 2024-02-05 ENCOUNTER — Encounter: Payer: Self-pay | Admitting: Medical

## 2024-02-05 ENCOUNTER — Other Ambulatory Visit: Payer: Self-pay | Admitting: Medical

## 2024-02-05 MED ORDER — VYVANSE 50 MG PO CAPS
50.0000 mg | ORAL_CAPSULE | Freq: Every morning | ORAL | 0 refills | Status: DC
Start: 2024-02-05 — End: 2024-03-14

## 2024-02-05 NOTE — Addendum Note (Signed)
 Addended by: Gwenevere Abbot on: 02/05/2024 09:52 PM   Modules accepted: Orders

## 2024-03-03 ENCOUNTER — Ambulatory Visit (HOSPITAL_BASED_OUTPATIENT_CLINIC_OR_DEPARTMENT_OTHER): Admitting: Cardiology

## 2024-03-03 ENCOUNTER — Encounter (HOSPITAL_BASED_OUTPATIENT_CLINIC_OR_DEPARTMENT_OTHER): Payer: Self-pay

## 2024-03-03 DIAGNOSIS — R011 Cardiac murmur, unspecified: Secondary | ICD-10-CM

## 2024-03-03 DIAGNOSIS — Z7189 Other specified counseling: Secondary | ICD-10-CM

## 2024-03-03 DIAGNOSIS — R55 Syncope and collapse: Secondary | ICD-10-CM

## 2024-03-05 ENCOUNTER — Other Ambulatory Visit (HOSPITAL_COMMUNITY)
Admission: RE | Admit: 2024-03-05 | Discharge: 2024-03-05 | Disposition: A | Discharge status: Home / Self Care | Source: Ambulatory Visit | Attending: Medical | Admitting: Medical

## 2024-03-05 ENCOUNTER — Ambulatory Visit: Admitting: Medical

## 2024-03-05 ENCOUNTER — Ambulatory Visit (HOSPITAL_BASED_OUTPATIENT_CLINIC_OR_DEPARTMENT_OTHER)
Admission: RE | Admit: 2024-03-05 | Discharge: 2024-03-05 | Disposition: A | Discharge status: Home / Self Care | Source: Ambulatory Visit | Attending: Medical | Admitting: Medical

## 2024-03-05 VITALS — BP 116/78 | HR 68 | Resp 18 | Ht 73.0 in | Wt 140.8 lb

## 2024-03-05 DIAGNOSIS — D696 Thrombocytopenia, unspecified: Secondary | ICD-10-CM | POA: Diagnosis not present

## 2024-03-05 DIAGNOSIS — R102 Pelvic and perineal pain: Secondary | ICD-10-CM

## 2024-03-05 DIAGNOSIS — R5383 Other fatigue: Secondary | ICD-10-CM | POA: Diagnosis not present

## 2024-03-05 DIAGNOSIS — N50819 Testicular pain, unspecified: Secondary | ICD-10-CM | POA: Insufficient documentation

## 2024-03-05 DIAGNOSIS — N50811 Right testicular pain: Secondary | ICD-10-CM

## 2024-03-05 DIAGNOSIS — N50812 Left testicular pain: Secondary | ICD-10-CM | POA: Insufficient documentation

## 2024-03-05 DIAGNOSIS — Q8742 Marfan's syndrome with ocular manifestations: Secondary | ICD-10-CM | POA: Diagnosis not present

## 2024-03-05 LAB — URINE CYTOLOGY ANCILLARY ONLY
Chlamydia: NEGATIVE
Comment: NEGATIVE
Comment: NORMAL
Neisseria Gonorrhea: NEGATIVE

## 2024-03-05 LAB — CBC WITH DIFFERENTIAL/PLATELET
Basophils Absolute: 0 10*3/uL (ref 0.0–0.1)
Basophils Relative: 0.6 % (ref 0.0–3.0)
Eosinophils Absolute: 0.2 10*3/uL (ref 0.0–0.7)
Eosinophils Relative: 3.6 % (ref 0.0–5.0)
HCT: 44 % (ref 39.0–52.0)
Hemoglobin: 14.4 g/dL (ref 13.0–17.0)
Lymphocytes Relative: 55.8 % — ABNORMAL HIGH (ref 12.0–46.0)
Lymphs Abs: 3.8 10*3/uL (ref 0.7–4.0)
MCHC: 32.8 g/dL (ref 30.0–36.0)
MCV: 90.4 fl (ref 78.0–100.0)
Monocytes Absolute: 0.5 10*3/uL (ref 0.1–1.0)
Monocytes Relative: 7.1 % (ref 3.0–12.0)
Neutro Abs: 2.2 10*3/uL (ref 1.4–7.7)
Neutrophils Relative %: 32.9 % — ABNORMAL LOW (ref 43.0–77.0)
Platelets: 143 10*3/uL — ABNORMAL LOW (ref 150.0–400.0)
RBC: 4.87 Mil/uL (ref 4.22–5.81)
RDW: 13.9 % (ref 11.5–15.5)
WBC: 6.8 10*3/uL (ref 4.0–10.5)

## 2024-03-05 LAB — PSA: PSA: 0.71 ng/mL (ref 0.10–4.00)

## 2024-03-05 NOTE — Progress Notes (Signed)
 Subjective:    Patient ID: Darrell Erickson, male    DOB: 04/25/1991, 33 y.o.   MRN: [id number]  HPI  Pt accompanied by wife.  Discussed the use of AI scribe software for clinical note transcription with the patient, who gave verbal consent to proceed.  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  He experiences bilateral testicular pain, described as a dull ache, with one side being more tender. A lump is present near the top of the testicle where the veins connect per pt, first noticed a few days ago but possibly present for about a month. No trauma or injury is reported. He also has an achy sensation in the perineum during urination, but no penile discharge or increased frequency of urination.  He reports significant fatigue, feeling exhausted to the point of being unable to perform daily activities without Vyvanse, which he previously used but stopped due to chest pain. Caffeine does not alleviate his lethargy. He has a history of syncope, having passed out twice in the past. He has low B12 and B1 levels, with B12 at 315, which is below the desired range. He has been advised to take B12 2000 mcg daily and B1 100 mg daily. His fatigue and lethargy may be exacerbated by these deficiencies.   Pt has seen cardiology and has echo pending.  He has been evaluated for Marfan syndrome after concerns were raised by a rheumatologist and an ophthalmologist. He is awaiting an echocardiogram in two weeks to assess cardiac involvement.  He has a history of low platelet count, which is being monitored.  He used to drink alcohol but has since reduced his intake significantly.          Review of Systems  Constitutional:  Negative for chills, fatigue and fever.  Respiratory:  Negative for cough, chest tightness and stridor.   Cardiovascular:  Negative for chest pain and palpitations.   Gastrointestinal:  Positive for rectal pain. Negative for abdominal pain and blood in stool.  Genitourinary:  Negative for dysuria, frequency, hematuria and penile pain.  Musculoskeletal:  Negative for back pain and joint swelling.  Skin:  Negative for rash.  Neurological:  Negative for facial asymmetry and light-headedness.  Hematological:  Negative for adenopathy. Does not bruise/bleed easily.  Psychiatric/Behavioral:  Negative for behavioral problems and dysphoric mood. The patient is not nervous/anxious and is not hyperactive.     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  Tobacco Use   Smoking status: Never    Passive exposure: Never   Smokeless tobacco: Never  Vaping Use   Vaping status: Never Used  Substance and Sexual Activity   Alcohol use: Yes    Alcohol/week: 2.0 standard drinks of alcohol    Types: 2  Cans of beer per week    Comment: recently 2 large bottle at night.   Drug use: Yes    Types: Marijuana   Sexual activity: Yes  Other Topics Concern   Not on file  Social History Narrative   Not on file   Social Drivers of Health   Financial Resource Strain: Patient Declined (12/15/2023)   Overall Financial Resource Strain (CARDIA)    Difficulty of Paying Living Expenses: Patient declined  Food Insecurity: Patient Declined (12/15/2023)   Hunger Vital Sign    Worried About Running Out of Food in the Last Year: Patient declined    Ran Out of Food in the Last Year: Patient declined  Transportation  Needs: No Transportation Needs (12/15/2023)   PRAPARE - Administrator, Civil Service (Medical): No    Lack of Transportation (Non-Medical): No  Physical Activity: Unknown (12/15/2023)   Exercise Vital Sign    Days of Exercise per Week: 0 days    Minutes of Exercise per Session: Not on file  Stress: Stress Concern Present (12/15/2023)   [employer] of Occupational Health - Occupational Stress Questionnaire    Feeling of Stress : Rather much  Social Connections: Moderately Isolated (12/15/2023)   Social Connection and Isolation Panel [NHANES]    Frequency of Communication with Friends and Family: Three times a week    Frequency of Social Gatherings with Friends and Family: Once a week    Attends Religious Services: Never    Database administrator or Organizations: No    Attends Engineer, structural: Not on file    Marital Status: Married  Catering manager Violence: Not At Risk (11/11/2023)   Received from [facility]   HITS    Over the last 12 months how often did your partner physically hurt you?: Never    Over the last 12 months how often did your partner insult you or talk down to you?: Never    Over the last 12 months how often did your partner threaten you with physical harm?: Never    Over the last 12 months how often did your partner scream or curse at you?: Never    Past Surgical History:  Procedure Laterality Date   CYSTECTOMY  2006   scalp    Family History  Problem Relation Age of Onset   Bipolar disorder Mother    Schizophrenia Mother    Lupus Father    Hypertension Paternal Uncle    Colon cancer Paternal Uncle    Hypertension Paternal Uncle    Colon cancer Paternal Uncle    Stomach cancer Neg Hx    Rectal cancer Neg Hx    Liver cancer Neg Hx    Pancreatic cancer Neg Hx     Allergies  Allergen Reactions   Shellfish-Derived Products Anaphylaxis and Itching   Amoxicillin Hives and Rash   Egg-Derived Products    Iodine      Current Outpatient Medications on File Prior to Visit  Medication Sig Dispense Refill   EPINEPHrine 0.3 mg/0.3 mL IJ SOAJ injection Inject 0.3 mg into the muscle as needed for anaphylaxis. 1 each 1   hydrOXYzine (ATARAX) 25 MG tablet Take 25-50 mg by mouth every 6 (six) hours as needed.     Sod Picosulfate-Mag Ox-Cit Acd (CLENPIQ) 10-3.5-12 MG-GM -GM/160ML SOLN Take 1 kit by mouth as directed. 320 mL 0   VENTOLIN HFA 108 (90 Base) MCG/ACT inhaler Inhale 2 puffs into the lungs every 6 (six) hours  as needed for wheezing or shortness of breath. 18 g 5   VRAYLAR 1.5 MG capsule Take 3 mg by mouth daily.     VYVANSE 50 MG capsule Take 1 capsule (50 mg total) by mouth every morning. 30 capsule 0   No current facility-administered medications on file prior to visit.    BP 116/78   Pulse 68   Resp 18   Ht 6\' 1"  (1.854 m)   Wt 140 lb 12.8 oz (63.9 kg)   SpO2 100%   BMI 18.58 kg/m        Objective:   Physical Exam   General Mental Status- Alert. General Appearance- Not in acute distress.   Skin General: Color- Normal Color. Moisture- Normal Moisture.  Neck Carotid Arteries- Normal color. Moisture- Normal Moisture. No carotid bruits. No JVD.  Chest and Lung Exam Auscultation: Breath Sounds:-CTA  Cardiovascular Auscultation:Rythm- RRR Murmurs & Other Heart Sounds:Auscultation of the heart reveals- No Murmurs.  Abdomen Inspection:-Inspeection Normal. Palpation/Percussion:Note:No mass. Palpation and Percussion of the abdomen reveal- Non Tender, Non Distended + BS, no rebound or guarding.   Neurologic Cranial Nerve exam:- CN III-XII intact(No nystagmus), symmetric smile. Strength:- 5/5 equal and symmetric strength both upper and lower extremities.    Genital exam- bilateral sllight palpable and tender epididymis both side. Ingulnal canals free from herniation.     Assessment & Plan:   Assessment and Plan    Testicular Pain Bilateral testicular pain with a lump near  the top of the testicle. Differential includes epididymitis. Penicillin allergy noted. - Order scrotal ultrasound. - Perform urine ancillary and culture. - Consider alternative antibiotics if epididymitis is confirmed, considering penicillin allergy in choosing antibiotic if needed.  Fatigue Persistent fatigue with low vitamin B12 and B1 levels. Vyvanse discontinued due to chest pain. - Initiate vitamin B12 supplementation at 2000 mcg daily. - Initiate vitamin B1 supplementation at 100 mg daily. - Recheck CBC to assess platelet levels.  Suspected Marfan Syndrome Suspected Marfan syndrome based on specialist observations. Awaiting echocardiogram and genetic evaluation. - Await echocardiogram results. - Proceed with genetic referral for Marfan syndrome evaluation.  Follow-up Follow-up contingent on pending test results. - Determine follow-up date based on lab and ultrasound results.        Esperanza Richters, PA-C

## 2024-03-05 NOTE — Patient Instructions (Signed)
 Testicular Pain Bilateral testicular pain with a lump near the top of the testicle. Differential includes epididymitis. Penicillin allergy noted. - Order scrotal ultrasound. - Perform urine ancillary and culture. - Consider alternative antibiotics if epididymitis is confirmed, considering penicillin allergy in choosing antibiotic if needed.  Fatigue Persistent fatigue with low vitamin B12 and B1 levels. Vyvanse discontinued due to chest pain. - Initiate vitamin B12 supplementation at 2000 mcg daily. - Initiate vitamin B1 supplementation at 100 mg daily. - Recheck CBC to assess platelet levels.  Suspected Marfan Syndrome Suspected Marfan syndrome based on specialist observations. Awaiting echocardiogram and genetic evaluation. - Await echocardiogram results. - Proceed with genetic referral for Marfan syndrome evaluation.  Follow-up Follow-up contingent on pending test results. - Determine follow-up date based on lab and ultrasound results.

## 2024-03-06 ENCOUNTER — Encounter: Payer: Self-pay | Admitting: Medical

## 2024-03-06 LAB — URINE CULTURE
MICRO NUMBER:: 16308402
SPECIMEN QUALITY:: ADEQUATE

## 2024-03-06 NOTE — Addendum Note (Signed)
 Addended by: Gwenevere Abbot on: 03/06/2024 06:13 AM   Modules accepted: Orders

## 2024-03-07 NOTE — Addendum Note (Signed)
 Addended by: Gwenevere Abbot on: 03/07/2024 01:45 PM   Modules accepted: Orders

## 2024-03-11 ENCOUNTER — Encounter: Admitting: Genetic Counselor

## 2024-03-11 ENCOUNTER — Ambulatory Visit (HOSPITAL_BASED_OUTPATIENT_CLINIC_OR_DEPARTMENT_OTHER)

## 2024-03-11 ENCOUNTER — Other Ambulatory Visit

## 2024-03-11 DIAGNOSIS — R011 Cardiac murmur, unspecified: Secondary | ICD-10-CM

## 2024-03-11 DIAGNOSIS — R55 Syncope and collapse: Secondary | ICD-10-CM | POA: Diagnosis not present

## 2024-03-11 DIAGNOSIS — Z7189 Other specified counseling: Secondary | ICD-10-CM | POA: Diagnosis not present

## 2024-03-11 LAB — ECHOCARDIOGRAM COMPLETE
Area-P 1/2: 5.02 cm2
S' Lateral: 3.49 cm

## 2024-03-13 ENCOUNTER — Ambulatory Visit: Admitting: Medical

## 2024-03-14 ENCOUNTER — Other Ambulatory Visit: Payer: Self-pay | Admitting: Family

## 2024-03-14 ENCOUNTER — Encounter (HOSPITAL_BASED_OUTPATIENT_CLINIC_OR_DEPARTMENT_OTHER): Payer: Self-pay

## 2024-03-14 ENCOUNTER — Encounter: Payer: Self-pay | Admitting: Family

## 2024-03-14 ENCOUNTER — Other Ambulatory Visit: Payer: Self-pay

## 2024-03-14 ENCOUNTER — Inpatient Hospital Stay: Attending: Hematology & Oncology

## 2024-03-14 ENCOUNTER — Inpatient Hospital Stay: Admitting: Family

## 2024-03-14 VITALS — BP 122/69 | HR 78 | Temp 98.1°F | Resp 18 | Ht 72.0 in | Wt 138.8 lb

## 2024-03-14 DIAGNOSIS — D696 Thrombocytopenia, unspecified: Secondary | ICD-10-CM | POA: Diagnosis present

## 2024-03-14 DIAGNOSIS — Z903 Acquired absence of stomach [part of]: Secondary | ICD-10-CM | POA: Diagnosis not present

## 2024-03-14 DIAGNOSIS — Z803 Family history of malignant neoplasm of breast: Secondary | ICD-10-CM

## 2024-03-14 LAB — RETICULOCYTES
Immature Retic Fract: 6.6 % (ref 2.3–15.9)
RBC.: 4.75 MIL/uL (ref 4.22–5.81)
Retic Count, Absolute: 51.3 10*3/uL (ref 19.0–186.0)
Retic Ct Pct: 1.1 % (ref 0.4–3.1)

## 2024-03-14 LAB — CBC WITH DIFFERENTIAL (CANCER CENTER ONLY)
Abs Immature Granulocytes: 0.04 10*3/uL (ref 0.00–0.07)
Basophils Absolute: 0.1 10*3/uL (ref 0.0–0.1)
Basophils Relative: 1 %
Eosinophils Absolute: 0.2 10*3/uL (ref 0.0–0.5)
Eosinophils Relative: 2 %
HCT: 42.1 % (ref 39.0–52.0)
Hemoglobin: 14.5 g/dL (ref 13.0–17.0)
Immature Granulocytes: 0 %
Lymphocytes Relative: 25 %
Lymphs Abs: 2.5 10*3/uL (ref 0.7–4.0)
MCH: 29.9 pg (ref 26.0–34.0)
MCHC: 34.4 g/dL (ref 30.0–36.0)
MCV: 86.8 fL (ref 80.0–100.0)
Monocytes Absolute: 1 10*3/uL (ref 0.1–1.0)
Monocytes Relative: 10 %
Neutro Abs: 6.3 10*3/uL (ref 1.7–7.7)
Neutrophils Relative %: 62 %
Platelet Count: 163 10*3/uL (ref 150–400)
RBC: 4.85 MIL/uL (ref 4.22–5.81)
RDW: 12.7 % (ref 11.5–15.5)
WBC Count: 10.1 10*3/uL (ref 4.0–10.5)
nRBC: 0 % (ref 0.0–0.2)

## 2024-03-14 LAB — CMP (CANCER CENTER ONLY)
ALT: 13 U/L (ref 0–44)
AST: 11 U/L — ABNORMAL LOW (ref 15–41)
Albumin: 4.6 g/dL (ref 3.5–5.0)
Alkaline Phosphatase: 84 U/L (ref 38–126)
Anion gap: 8 (ref 5–15)
BUN: 20 mg/dL (ref 6–20)
CO2: 30 mmol/L (ref 22–32)
Calcium: 9.2 mg/dL (ref 8.9–10.3)
Chloride: 101 mmol/L (ref 98–111)
Creatinine: 1.07 mg/dL (ref 0.61–1.24)
GFR, Estimated: >60 mL/min (ref >60)
Glucose, Bld: 96 mg/dL (ref 70–99)
Potassium: 3.8 mmol/L (ref 3.5–5.1)
Sodium: 139 mmol/L (ref 135–145)
Total Bilirubin: 0.4 mg/dL (ref 0.0–1.2)
Total Protein: 6.8 g/dL (ref 6.5–8.1)

## 2024-03-14 LAB — SAMPLE TO BLOOD BANK

## 2024-03-14 LAB — LACTATE DEHYDROGENASE: LDH: 112 U/L (ref 98–192)

## 2024-03-14 LAB — SAVE SMEAR(SSMR), FOR PROVIDER SLIDE REVIEW

## 2024-03-14 NOTE — Telephone Encounter (Signed)
 Folllow up questions

## 2024-03-14 NOTE — Progress Notes (Signed)
 Hematology/Oncology Consultation   Name: TRIGG DELAROCHA      MRN: [id number]    Location: Room/bed info not found  Date: 03/14/2024 Time:9:21 AM   REFERRING PHYSICIAN:  Sylvia Everts, PA-C  REASON FOR CONSULT:  Thrombocytopenia    DIAGNOSIS: Thrombocytopenia   HISTORY OF PRESENT ILLNESS:  Mr. Onder is a pleasant 33 yo gentleman with an intermittent mild decrease in platelet count (136-143). This is very inconsistent.  Today's platelets count is 163, Hgb 14.5, MCV 86 and WBC count 10.1.  He is symptomatic with fatigue, weakness, SOB with exertion, dizziness sore throat, chest discomfort and palpitations, rash occurring across cheeks and back, chills, numbness and tingling in the hands and feet that comes and goes.  He has a metallic taste, sweats and nausea without vomiting after eating. He also notes that to the right of the umbilicus will go numb and feels bloated after eating.  Appetite is decreased do to his GI symptoms. Hydration is good. Weight is stable at 138 lbs.  He states that he has felt tender, enlarged submandibular nodes on the right and left. He has also noted fullness under both arms and side of his groin.  US  of the thyroid  in February was negative.  He also had a CTA with [facility] last week. This was also negative.  His father had lupus effecting his kidneys.   He states that he is also being worked up for Lupus vs Marfans. He has an appointment  He has had issues with dribbling when urinating and feeling like he can't empty. He has an appointment coming up with urology next week.  US  of the scrotum earlier this months showed a left varicocele.  He was born around 6 months gestation and weighed less than 2 lbs. 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  No personal history of cancer. His maternal great grandmother had leukemia and maternal grandfather had brain cancer.  No history of  diabetes or thyroid  disease.  He smokes weed and hemp wraps. No cigarettes or ETOH.  No fever, cough or changes in bowel habits.  No falls or syncope reported.  He lives with his wife and 19 yo daughter. They really enjoy Anime.  Prior to developing the above symptoms he was active and did a lot of [employer].    ROS: All other 10 point review of systems is negative.   PAST MEDICAL HISTORY:   Past Medical History:  Diagnosis Date   Allergy    Asthma    Family history of heart murmur    childhood   H/O hernia repair    As newborn   Premature birth    Was born at ~24 weeks   Retinal detachment    as a newborn   Scalp cyst 2006    ALLERGIES: Allergies  Allergen Reactions   Shellfish-Derived Products Anaphylaxis and Itching   Amoxicillin Hives and Rash   Egg-Derived Products    Iodine       MEDICATIONS:  Current Outpatient Medications on File Prior to Visit  Medication Sig Dispense Refill   EPINEPHrine  0.3 mg/0.3 mL IJ SOAJ injection Inject 0.3 mg into the muscle as needed for anaphylaxis. 1 each 1   hydrOXYzine (ATARAX) 25 MG tablet Take 25-50 mg by mouth every 6 (six) hours as needed.     Sod Picosulfate-Mag Ox-Cit Acd (CLENPIQ ) 10-3.5-12 MG-GM -GM/160ML SOLN Take 1 kit  by mouth as directed. 320 mL 0   VENTOLIN  HFA 108 (90 Base) MCG/ACT inhaler Inhale 2 puffs into the lungs every 6 (six) hours as needed for wheezing or shortness of breath. 18 g 5   VRAYLAR 1.5 MG capsule Take 3 mg by mouth daily.     VYVANSE  50 MG capsule Take 1 capsule (50 mg total) by mouth every morning. 30 capsule 0   No current facility-administered medications on file prior to visit.     PAST SURGICAL HISTORY Past Surgical History:  Procedure Laterality Date   CYSTECTOMY  2006   scalp    FAMILY HISTORY: Family History  Problem Relation Age of Onset   Bipolar disorder Mother    Schizophrenia Mother    Lupus Father    Hypertension Paternal Uncle    Colon cancer Paternal Uncle     Hypertension Paternal Uncle    Colon cancer Paternal Uncle    Stomach cancer Neg Hx    Rectal cancer Neg Hx    Liver cancer Neg Hx    Pancreatic cancer Neg Hx     SOCIAL HISTORY:  reports that he has never smoked. He has never been exposed to tobacco smoke. He has never used smokeless tobacco. He reports current alcohol use of about 2.0 standard drinks of alcohol per week. He reports current drug use. Drug: Marijuana.  PERFORMANCE STATUS: The patient's performance status is 1 - Symptomatic but completely ambulatory  PHYSICAL EXAM: Most Recent Vital Signs: There were no vitals taken for this visit. There were no vitals taken for this visit.  General Appearance:    Alert, cooperative, no distress, appears stated age  Head:    Normocephalic, without obvious abnormality, atraumatic  Eyes:    PERRL, conjunctiva/corneas clear, EOM's intact, fundi    benign, both eyes             Throat:   Lips, mucosa, and tongue normal; teeth and gums normal  Neck:   Supple, symmetrical, trachea midline, no adenopathy;       thyroid :  No enlargement/tenderness/nodules; no carotid   bruit or JVD  Back:     Symmetric, no curvature, ROM normal, no CVA tenderness  Lungs:     Clear to auscultation bilaterally, respirations unlabored  Chest wall:    No tenderness or deformity  Heart:    Regular rate and rhythm, S1 and S2 normal, no murmur, rub   or gallop  Abdomen:     Soft, non-tender, bowel sounds active all four quadrants,    no masses, no organomegaly        Extremities:   Extremities normal, atraumatic, no cyanosis or edema  Pulses:   2+ and symmetric all extremities  Skin:   Skin color, texture, turgor normal, no rashes or lesions  Lymph nodes:   Cervical, supraclavicular, and axillary nodes normal  Neurologic:   CNII-XII intact. Normal strength, sensation and reflexes      throughout    LABORATORY DATA:  Results for orders placed or performed in visit on 03/14/24 (from the past 48 hours)   CBC with Differential (Cancer Center Only)     Status: None   Collection Time: 03/14/24  9:01 AM  Result Value Ref Range   WBC Count 10.1 4.0 - 10.5 K/uL   RBC 4.85 4.22 - 5.81 MIL/uL   Hemoglobin 14.5 13.0 - 17.0 g/dL   HCT 95.6 21.3 - 08.6 %   MCV 86.8 80.0 - 100.0 fL   MCH 29.9 26.0 -  34.0 pg   MCHC 34.4 30.0 - 36.0 g/dL   RDW 84.6 96.2 - 95.2 %   Platelet Count 163 150 - 400 K/uL   nRBC 0.0 0.0 - 0.2 %   Neutrophils Relative % 62 %   Neutro Abs 6.3 1.7 - 7.7 K/uL   Lymphocytes Relative 25 %   Lymphs Abs 2.5 0.7 - 4.0 K/uL   Monocytes Relative 10 %   Monocytes Absolute 1.0 0.1 - 1.0 K/uL   Eosinophils Relative 2 %   Eosinophils Absolute 0.2 0.0 - 0.5 K/uL   Basophils Relative 1 %   Basophils Absolute 0.1 0.0 - 0.1 K/uL   Immature Granulocytes 0 %   Abs Immature Granulocytes 0.04 0.00 - 0.07 K/uL    Comment: Performed at [facility], [address] [place], [place] [address]  Reticulocytes     Status: None   Collection Time: 03/14/24  9:01 AM  Result Value Ref Range   Retic Ct Pct 1.1 0.4 - 3.1 %   RBC. 4.75 4.22 - 5.81 MIL/uL   Retic Count, Absolute 51.3 19.0 - 186.0 K/uL   Immature Retic Fract 6.6 2.3 - 15.9 %    Comment: Performed at [facility], [address] [place], [place] [address]      RADIOGRAPHY: No results found.     PATHOLOGY: None  ASSESSMENT/PLAN: Mr. Yuan is a pleasant 33 yo gentleman with an intermittent mild decrease in platelet count (136-143).  CBC with diff and blood smear reviewed in detail with Dr. Maria Shiner. He had a few large platelets but smear otherwise unremarkable.  We will see what rheumatology finds during his work up for Lupus/Marfans.  Follow-up in 6 months.   All questions were answered. The patient knows to call the clinic with any problems, questions or concerns. We can certainly see the patient much sooner if necessary.  The patient was discussed with Dr. Maria Shiner and he is in agreement with the  aforementioned.   Kennard Pea. NP

## 2024-03-17 NOTE — Telephone Encounter (Signed)
 Pt has an upcoming urology appt on 03/20/24

## 2024-03-17 NOTE — Telephone Encounter (Signed)
 Pt called and scheduled

## 2024-03-18 ENCOUNTER — Ambulatory Visit: Admitting: Medical

## 2024-03-19 ENCOUNTER — Ambulatory Visit: Admitting: Medical

## 2024-03-20 ENCOUNTER — Ambulatory Visit (INDEPENDENT_AMBULATORY_CARE_PROVIDER_SITE_OTHER): Admitting: Urology

## 2024-03-20 ENCOUNTER — Encounter: Payer: Self-pay | Admitting: Urology

## 2024-03-20 VITALS — BP 133/82 | HR 66 | Ht 72.0 in | Wt 145.0 lb

## 2024-03-20 DIAGNOSIS — I861 Scrotal varices: Secondary | ICD-10-CM | POA: Insufficient documentation

## 2024-03-20 DIAGNOSIS — N5082 Scrotal pain: Secondary | ICD-10-CM

## 2024-03-20 DIAGNOSIS — N451 Epididymitis: Secondary | ICD-10-CM | POA: Insufficient documentation

## 2024-03-20 LAB — URINALYSIS, ROUTINE W REFLEX MICROSCOPIC
Bilirubin, UA: NEGATIVE
Glucose, UA: NEGATIVE
Leukocytes,UA: NEGATIVE
Nitrite, UA: NEGATIVE
RBC, UA: NEGATIVE
Specific Gravity, UA: 1.02 (ref 1.005–1.030)
Urobilinogen, Ur: 0.2 mg/dL (ref 0.2–1.0)
pH, UA: 5.5 (ref 5.0–7.5)

## 2024-03-20 LAB — MICROSCOPIC EXAMINATION: Bacteria, UA: NONE SEEN

## 2024-03-20 LAB — BLADDER SCAN AMB NON-IMAGING

## 2024-03-20 MED ORDER — DOXYCYCLINE HYCLATE 100 MG PO CAPS: 100.0000 mg | ORAL_CAPSULE | Freq: Two times a day (BID) | ORAL | 0 refills | Status: AC

## 2024-03-20 NOTE — Progress Notes (Addendum)
 Assessment: 1. Scrotal pain   2. Epididymitis, left   3. Left varicocele     Plan: I personally reviewed the patient's chart including provider notes, lab and imaging results. I reviewed the scrotal ultrasound with results as noted below. Doxycycline  100 mg BID x 10 days.  Rx sent. His lower urinary tract symptoms are improving.  Will monitor at this time. Return to office in 6 weeks.  Chief Complaint:  Chief Complaint  Patient presents with   Testicle Pain    History of Present Illness:  Darrell Erickson is a 33 y.o. male who is seen in consultation from Sylvia Everts, PA-C for evaluation of scrotal pain.  He has noted intermittent pain in the left scrotum for approximately 1 year.  He reports this has been associated with occasional swelling.  He recently had an increase in the severity of the pain necessitating evaluation. Scrotal ultrasound from 03/05/2024 showed normal testes bilaterally and a left varicocele. PSA 0.71 Urine culture:  <10K colonies  He also has noted an increase in his lower urinary tract symptoms with straining to void and some dysuria.  He had right sided abdominal pain with radiation to the right flank area, typically after eating.  He reports that his urinary symptoms have improved within the past week.  IPSS = 25/5.  No history of kidney stones or UTIs.   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 No issues eating  peanuts.   Amoxicillin Hives and Rash   Egg-Derived Products Nausea And Vomiting    Family History:  Family History  Problem Relation Age of Onset   Bipolar disorder Mother    Schizophrenia Mother    Lupus Father    Hypertension Paternal Uncle    Colon cancer Paternal Uncle    Hypertension Paternal Uncle    Colon cancer Paternal Uncle    Stomach cancer Neg Hx    Rectal cancer Neg Hx    Liver cancer Neg Hx    Pancreatic cancer Neg Hx     Social History:  Social History   Tobacco Use   Smoking status: Never    Passive exposure: Never   Smokeless tobacco: Never  Vaping Use   Vaping status: Never Used  Substance Use Topics   Alcohol use: Yes    Alcohol/week: 2.0 standard drinks of alcohol    Types: 2 Cans of beer per week    Comment: recently 2 large bottle at night.   Drug use: Yes    Types: Marijuana    Review of symptoms:  Constitutional:  Negative for unexplained weight loss, night sweats, fever, chills ENT:  Negative for nose bleeds, sinus pain, painful swallowing CV:  Negative for chest pain, shortness of breath, exercise intolerance, palpitations, loss of consciousness Resp:  Negative for cough, wheezing, shortness  of breath GI:  Negative for nausea, vomiting, diarrhea, bloody stools GU:  Positives noted in HPI; otherwise negative for gross hematuria, dysuria, urinary incontinence Neuro:  Negative for seizures, poor balance, limb weakness, slurred speech Psych:  Negative for lack of energy, depression, anxiety Endocrine:  Negative for polydipsia, polyuria, symptoms of hypoglycemia (dizziness, hunger, sweating) Hematologic:  Negative for anemia, purpura, petechia, prolonged or excessive bleeding, use of anticoagulants  Allergic:  Negative for difficulty breathing or choking as a result of exposure to anything; no shellfish allergy; no allergic response (rash/itch) to materials, foods  Physical exam: BP 133/82   Pulse 66   Ht 6' (1.829 m)   Wt 145 lb (65.8 kg)    BMI 19.67 kg/m  GENERAL APPEARANCE:  Well appearing, well developed, well nourished, NAD HEENT: Atraumatic, Normocephalic, oropharynx clear. NECK: Supple without lymphadenopathy or thyromegaly. LUNGS: Clear to auscultation bilaterally. HEART: Regular Rate and Rhythm without murmurs, gallops, or rubs. ABDOMEN: Soft, non-tender, No Masses. EXTREMITIES: Moves all extremities well.  Without clubbing, cyanosis, or edema. NEUROLOGIC:  Alert and oriented x 3, normal gait, CN II-XII grossly intact.  MENTAL STATUS:  Appropriate. BACK:  Non-tender to palpation.  No CVAT SKIN:  Warm, dry and intact.  GU: Penis:  circumcised Meatus: Normal Scrotum: normal, no masses, minimal varicocele on left Testis: normal without masses bilateral Epididymis: mildly tender on left    Results: U/A: 0-5 WBCs, 0-2 RBCs  PVR = 1 ml

## 2024-03-21 ENCOUNTER — Ambulatory Visit: Admitting: Medical

## 2024-03-24 ENCOUNTER — Other Ambulatory Visit (HOSPITAL_BASED_OUTPATIENT_CLINIC_OR_DEPARTMENT_OTHER)

## 2024-03-25 ENCOUNTER — Ambulatory Visit (INDEPENDENT_AMBULATORY_CARE_PROVIDER_SITE_OTHER): Admitting: Medical

## 2024-03-25 ENCOUNTER — Ambulatory Visit (INDEPENDENT_AMBULATORY_CARE_PROVIDER_SITE_OTHER): Admitting: Medical Genetics

## 2024-03-25 ENCOUNTER — Encounter: Payer: Self-pay | Admitting: Medical

## 2024-03-25 VITALS — BP 112/78 | HR 54 | Ht 73.0 in | Wt 137.0 lb

## 2024-03-25 VITALS — BP 128/74 | HR 62 | Resp 18 | Ht 72.0 in | Wt 140.0 lb

## 2024-03-25 DIAGNOSIS — R591 Generalized enlarged lymph nodes: Secondary | ICD-10-CM | POA: Diagnosis not present

## 2024-03-25 DIAGNOSIS — R809 Proteinuria, unspecified: Secondary | ICD-10-CM | POA: Diagnosis not present

## 2024-03-25 DIAGNOSIS — J4530 Mild persistent asthma, uncomplicated: Secondary | ICD-10-CM | POA: Diagnosis not present

## 2024-03-25 DIAGNOSIS — Z1379 Encounter for other screening for genetic and chromosomal anomalies: Secondary | ICD-10-CM

## 2024-03-25 DIAGNOSIS — R55 Syncope and collapse: Secondary | ICD-10-CM

## 2024-03-25 DIAGNOSIS — N451 Epididymitis: Secondary | ICD-10-CM | POA: Diagnosis not present

## 2024-03-25 DIAGNOSIS — R636 Underweight: Secondary | ICD-10-CM | POA: Diagnosis not present

## 2024-03-25 DIAGNOSIS — H543 Unqualified visual loss, both eyes: Secondary | ICD-10-CM

## 2024-03-25 DIAGNOSIS — Q121 Congenital displaced lens: Secondary | ICD-10-CM | POA: Diagnosis not present

## 2024-03-25 DIAGNOSIS — M2480 Other specific joint derangements of unspecified joint, not elsewhere classified: Secondary | ICD-10-CM | POA: Insufficient documentation

## 2024-03-25 DIAGNOSIS — J309 Allergic rhinitis, unspecified: Secondary | ICD-10-CM

## 2024-03-25 DIAGNOSIS — Z87892 Personal history of anaphylaxis: Secondary | ICD-10-CM

## 2024-03-25 LAB — POC URINALSYSI DIPSTICK (AUTOMATED)
Blood, UA: NEGATIVE
Glucose, UA: NEGATIVE
Leukocytes, UA: NEGATIVE
Nitrite, UA: NEGATIVE
Protein, UA: NEGATIVE
Spec Grav, UA: 1.02 (ref 1.010–1.025)
Urobilinogen, UA: 0.2 U/dL
pH, UA: 5 (ref 5.0–8.0)

## 2024-03-25 NOTE — Progress Notes (Signed)
 Subjective:    Patient ID: Darrell Erickson, male    DOB: 1991-07-10, 33 y.o.   MRN: [id number]  HPI  Patient is in for follow-up on various conditions.  Patient was recently seen by urologist for epididymitis.  Urologist prescribed doxycycline  and patient's epididymal pain has resolved significantly.  However he still reports a little bit of a mild dysuria and a little bit of hesitant urine flow.  These have improved some since starting doxycycline .  Patient's PSA was normal.  Patient noticed that left submandibular node was a little swollen recently and that he took the doxycycline  that area came down and the lymph node feels almost normal.  Patient speculates that the pain could be related to tooth infection.  He does not report any specific tooth pain but he notes that he has bleeding in the gums when he flosses lower teeth.   He also has another complaint that protein was found in his urine and ketones were found in his urine.  He has a concern about kidney function since his dad developed kidney disease after his dad was diagnosed with lupus.  However on review I did explain to patient that 1 recent GFR was over 100.  Also he requests referral to asthma and allergy as he has a history of asthma using albuterol  about twice per week.  His allergy rhinitis type symptoms are presently well-controlled.  He has a history of anaphylaxis to shellfish and he wants to be retested to make sure he is not having severe allergic reaction to anything else.  He states he was told to get retested in the future by former allergist.   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  Tobacco Use   Smoking status: Never    Passive exposure: Never   Smokeless tobacco: Never  Vaping Use   Vaping status: Never Used  Substance and Sexual Activity   Alcohol use: Yes    Alcohol/week: 2.0 standard drinks of alcohol    Types: 2 Cans of beer per week    Comment: recently 2 large bottle at night.   Drug use: Yes    Types: Marijuana   Sexual activity: Yes  Other Topics Concern   Not on file  Social History Narrative   Not on file   Social Drivers of Health   Financial Resource Strain: Patient Declined (12/15/2023)   Overall Financial Resource Strain (CARDIA)    Difficulty of Paying Living Expenses: Patient declined  Food Insecurity: Low Risk  (03/14/2024)   Received from [facility]   Hunger Vital Sign    Worried About Running Out of Food in the Last Year: Never true    Ran Out of Food in the Last Year: Never true  Transportation Needs: No Transportation Needs (03/14/2024)   Received from [facility]    In  the past 12 months, has lack of reliable transportation kept you from medical appointments, meetings, work or from getting things needed for daily living? : No  Physical Activity: Unknown (12/15/2023)   Exercise Vital Sign    Days of Exercise per Week: 0 days    Minutes of Exercise per Session: Not on file  Stress: Stress Concern Present (12/15/2023)   [employer] of Occupational Health - Occupational Stress Questionnaire    Feeling of Stress : Rather much  Social Connections: Moderately Isolated (12/15/2023)   Social Connection and Isolation Panel [NHANES]    Frequency of Communication with Friends and Family: Three times a week    Frequency of Social Gatherings with Friends and Family: Once a week    Attends Religious Services: Never    Database administrator or Organizations: No    Attends Probation officer: Not on file    Marital Status: Married  Catering manager Violence: Not At Risk (03/14/2024)   Humiliation, Afraid, Rape, and Kick questionnaire    Fear of Current or Ex-Partner: No    Emotionally Abused: No    Physically Abused: No    Sexually Abused: No    Past Surgical History:  Procedure Laterality Date   CYSTECTOMY  2006   scalp    Family History  Problem Relation Age of Onset   Bipolar disorder Mother    Schizophrenia Mother    Lupus Father    Hypertension Paternal Uncle    Colon cancer Paternal Uncle    Hypertension Paternal Uncle    Colon cancer Paternal Uncle    Stomach cancer Neg Hx    Rectal cancer Neg Hx    Liver cancer Neg Hx    Pancreatic cancer Neg Hx     Allergies  Allergen Reactions   Iodine Hives and Itching   Shellfish-Derived Products Anaphylaxis and Itching   Walnut Anaphylaxis and Shortness Of Breath    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 No issues eating peanuts.   Amoxicillin Hives and Rash   Egg-Derived Products Nausea And Vomiting    Current Outpatient Medications on File Prior to Visit  Medication Sig Dispense Refill   doxycycline  (VIBRAMYCIN ) 100 MG capsule Take 1 capsule (100 mg total) by mouth every 12 (twelve) hours for 10 days. 20 capsule 0   EPINEPHrine  0.3 mg/0.3 mL IJ SOAJ injection Inject 0.3 mg into the muscle as needed for anaphylaxis. 1 each 1   VENTOLIN  HFA 108 (90 Base) MCG/ACT inhaler Inhale 2 puffs into the lungs every 6 (six) hours as needed for wheezing or shortness of breath. 18 g 5   No current facility-administered medications on file prior to visit.    BP 128/74   Pulse 62   Resp 18   Ht 6' (1.829 m)   Wt 140 lb (63.5 kg)   SpO2 100%   BMI 18.99 kg/m       Review of Systems  Constitutional:  Negative for chills, fatigue and fever.  HENT:  Negative for congestion, ear pain, hearing loss and postnasal drip.   Respiratory:  Negative for cough, chest tightness, shortness of breath and  wheezing.        Some wheezing twice a week.  Cardiovascular:  Negative for chest pain and palpitations.  Gastrointestinal:  Negative for abdominal pain, diarrhea and nausea.  Genitourinary:  Negative for dysuria, frequency, penile swelling and testicular pain.       Epididymal pain much improved.  Musculoskeletal:  Negative for back pain  and joint swelling.  Skin:  Negative for rash.  Neurological:  Negative for dizziness, speech difficulty, weakness and light-headedness.  Hematological:  Positive for adenopathy. Does not bruise/bleed easily.  Psychiatric/Behavioral:  Negative for behavioral problems and decreased concentration. The patient is not nervous/anxious.     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  Tobacco Use   Smoking status: Never    Passive exposure: Never   Smokeless tobacco: Never  Vaping Use   Vaping status: Never Used  Substance and Sexual Activity   Alcohol use: Yes    Alcohol/week: 2.0 standard drinks of alcohol    Types: 2 Cans of beer per week    Comment: recently 2 large bottle at night.   Drug use: Yes    Types: Marijuana   Sexual activity: Yes  Other Topics Concern   Not on file  Social History Narrative   Not on file   Social Drivers of Health   Financial Resource Strain: Patient Declined (12/15/2023)   Overall Financial Resource Strain (CARDIA)    Difficulty of Paying Living Expenses: Patient declined  Food Insecurity: Low Risk  (03/14/2024)   Received from [facility]    Hunger Vital Sign    Worried About Running Out of Food in the Last Year: Never true    Ran Out of Food in the Last Year: Never true  Transportation Needs: No Transportation Needs (03/14/2024)   Received from [facility]    In the past 12 months, has lack of reliable transportation kept you from medical appointments, meetings, work or from getting things needed for daily living? : No  Physical Activity: Unknown (12/15/2023)   Exercise Vital Sign    Days of Exercise per Week: 0 days    Minutes of Exercise per Session: Not on file  Stress: Stress Concern Present (12/15/2023)   [employer] of Occupational Health - Occupational Stress Questionnaire    Feeling of Stress : Rather much  Social Connections: Moderately Isolated (12/15/2023)   Social Connection and Isolation Panel [NHANES]    Frequency of Communication with Friends and Family: Three times a week    Frequency of Social Gatherings with Friends and Family: Once a week    Attends Religious Services: Never    Database administrator or Organizations: No    Attends Engineer, structural: Not on file    Marital Status: Married  Catering manager Violence: Not At Risk (03/14/2024)   Humiliation, Afraid, Rape, and Kick questionnaire    Fear of Current or Ex-Partner: No    Emotionally Abused: No    Physically Abused: No    Sexually Abused: No    Past Surgical History:  Procedure Laterality Date   CYSTECTOMY  2006   scalp    Family History  Problem Relation Age of Onset   Bipolar disorder Mother  Schizophrenia Mother    Lupus Father    Hypertension Paternal Uncle    Colon cancer Paternal Uncle    Hypertension Paternal Uncle    Colon cancer Paternal Uncle    Stomach cancer Neg Hx    Rectal cancer Neg Hx    Liver cancer Neg Hx    Pancreatic cancer Neg Hx     Allergies  Allergen Reactions   Iodine Hives and Itching   Shellfish-Derived Products Anaphylaxis and Itching   Walnut Anaphylaxis and  Shortness Of Breath    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 No issues eating peanuts.   Amoxicillin Hives and Rash   Egg-Derived Products Nausea And Vomiting    Current Outpatient Medications on File Prior to Visit  Medication Sig Dispense Refill   doxycycline  (VIBRAMYCIN ) 100 MG capsule Take 1 capsule (100 mg total) by mouth every 12 (twelve) hours for 10 days. 20 capsule 0   EPINEPHrine  0.3 mg/0.3 mL IJ SOAJ injection Inject 0.3 mg into the muscle as needed for anaphylaxis. 1 each 1   VENTOLIN  HFA 108 (90 Base) MCG/ACT inhaler Inhale 2 puffs into the lungs every 6 (six) hours as needed for wheezing or shortness of breath. 18 g 5   No current facility-administered medications on file prior to visit.    BP 128/74   Pulse 62   Resp 18   Ht 6' (1.829 m)   Wt 140 lb (63.5 kg)   SpO2 100%   BMI 18.99 kg/m        Objective:   Physical Exam  General Mental Status- Alert. General Appearance- Not in acute distress.   Skin General: Color- Normal Color. Moisture- Normal Moisture.  Neck Carotid Arteries- Normal color. Moisture- Normal Moisture. No carotid bruits. No JVD.  Chest and Lung Exam Auscultation: Breath Sounds:-CTA  Cardiovascular Auscultation:Rythm- RRR Murmurs & Other Heart Sounds:Auscultation of the heart reveals- No Murmurs.  Abdomen Inspection:-Inspeection Normal. Palpation/Percussion:Note:No mass. Palpation and Percussion of the abdomen reveal- Non Tender, Non Distended + BS, no rebound or guarding.   Neurologic Cranial Nerve exam:- CN III-XII intact(No nystagmus), symmetric smile. Strength:- 5/5 equal and symmetric strength both upper and lower extremities.       Assessment & Plan:   Patient Instructions  Lymphadenopathy left submandibular area.  Significantly improved after being on doxycycline .  No appreciable obvious difference on exam today.  Do recommend that you schedule appointment with a dentist.  I gave you 2 locations today that I recommend you trying  to schedule.  If unable to see dentist and if after doxycycline  discontinued would consider getting a soft tissue ultrasound of the neck/submandibular area if lymph node enlarges again.  CBC done at hematologist office was reassuring.  Epididymitis clinically improved with the doxycycline .  Continue full course.  If after doxycycline  you have any residual hesitant urine flow or urethral type pain then recommend calling your urologist for follow-up and to see if they could help further extend doxycycline   Protein in urine and ketones in the urine.  I think this reflects dehydration.  Repeating urinalysis today.  If both protein and ketones show then would refer to nephrologist at your request.  If only ketones show then would recommend hydrating well to prevent dehydration.  And will periodically retest kidney function with metabolic panel.  Currently GFR over 100 is very reassuring  Placed allergist referral for intermittent asthma, history of allergic rhinitis and history of anaphylaxis.  Follow-up in 2 weeks or sooner if needed   [place],  PA-C

## 2024-03-25 NOTE — Progress Notes (Unsigned)
 MEDICAL GENETICS NEW PATIENT EVALUATION  Patient name: Darrell Erickson DOB: October 18, 1991 Age: 33 y.o. MRN: [id number]  Referring Provider/Specialty: Linzie Rickers, PA  Date of Evaluation: 03/25/2024 Chief Complaint/Reason for Referral: Tall habitus  Assessment: We discussed with Mr. Darrell Erickson that since he does not have aortic root dilation, he would not meet the criteria for a clinical diagnosis of Marfan syndrome at this time. However, he does have several nonspecific connective tissue difference on exam, such as distal extremity joint hypermobility, some thin scars, lumbar striae, mild scoliosis, increased arm span, positive wrist sign, and chest asymmetry. Appropriate testing to consider at this time would be a gene panel of various connective tissue disorders. Mr. Schuelke was interested in this being performed, and consent and a buccal swab were obtained for a connective tissue disorders gene panel through [facility]. Results are expected in 1 month, and we will contact him when they return. Additional recommendations will be provided based on the results of his testing. Activities are as tolerated, and he should follow up with his current providers per their recommendations.  Recommendations: Connective tissue disorders panel through [facility] - results expected in 1 month. Continue follow up with current medical providers per their recommendations. Activities as tolerated  Follow up in genetics clinic will be based on the results of the testing.   HPI: Darrell Erickson is a 32 y.o. assigned male at birth who presents today for an initial genetics evaluation for connective tissue symptoms. He is accompanied by his spouse, who helped provide some of the history. This information, along with a review of pertinent records, labs, and radiology studies, is summarized below.  Mr. Soni was born at [redacted] weeks gestation in [place] . Complications of his premature birth include eye and breathing  issues. He had a retinal detachment as an infant. 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 He has recently been seen by Dr. Cyrus Drone for his eye issues. 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 He also wears a patch over his left eye. Wears a contact in his right eye.  His medical concerns include: - Ophtho: see HPI - ENT: hears well, slight overbite, crowding of lower teeth, has not had braces - Pulm: asthma, uses an inhaler sporadically, reports having a pneumothorax when a teenager requiring hospitalization - Cardio: syncopal episodes after donating plasma, saw cardiology in 09/2023, echo in 02/2024 with normal aorta and valves - GI: abdominal pain, constipation, intermittent rectal bleeding, saw GI and recommended to have a colonoscopy - GU: scrotal pain for the past year, saw urology recently and diagnosed with epididymitis, was treated with antibiotics - Neuro: headaches sporadically and occur with stress - Ortho: tall/thin habitus, positive ANA, joint and muscle pain, no dislocations but considers himself flexible, protrusion of sternum with some activity, saw rheumatology in 05/2023, had several autoantibodies sent, 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 - Derm: cyst removed from posterior scalp in 05/2008, rash on face and trunk at times, has some stretch marks, raised scars - Heme: thrombocytopenia - Sleep: insomnia - Psych: ADHD, bipolar 1 disorder  Past Medical History: Patient Active Problem List   Diagnosis Date Noted   Epididymitis, left 03/20/2024   Left varicocele 03/20/2024   Lymphadenopathy 07/10/2023   Syncope 07/10/2023  Blind in both eyes 06/05/2023   Asthma 06/05/2023   Toenail fungus 06/05/2023   Positive ANA (antinuclear antibody) 06/05/2023   BRBPR (bright  red blood per rectum) 06/05/2023   Legally blind in right eye, as defined in [place]  05/22/2023   Bipolar I disorder (HCC) 11/29/2018   Low weight for height 11/29/2018   Mild intermittent asthma 11/29/2018   Multiple food allergies 11/29/2018   Plantar wart, right foot 11/29/2018   Thrombocytopenia (HCC) 11/29/2018   Developmental History: School -- completed HS and Associates degree, did not require any assistance in school but did have an IEP  Medications: Current Outpatient Medications on File Prior to Visit  Medication Sig Dispense Refill   doxycycline  (VIBRAMYCIN ) 100 MG capsule Take 1 capsule (100 mg total) by mouth every 12 (twelve) hours for 10 days. 20 capsule 0   EPINEPHrine  0.3 mg/0.3 mL IJ SOAJ injection Inject 0.3 mg into the muscle as needed for anaphylaxis. 1 each 1   VENTOLIN  HFA 108 (90 Base) MCG/ACT inhaler Inhale 2 puffs into the lungs every 6 (six) hours as needed for wheezing or shortness of breath. 18 g 5   No current facility-administered medications on file prior to visit.   Allergies:  Allergies  Allergen Reactions   Iodine Hives and Itching   Shellfish-Derived Products Anaphylaxis and Itching   Walnut Anaphylaxis and Shortness Of Breath    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 No issues eating peanuts.   Amoxicillin Hives and Rash   Egg-Derived Products Nausea And Vomiting   Review of Systems: Negative except as noted in the HPI  Family History: The family history was notable for the following: Daughter, 76 yo, born at [redacted] weeks GA requiring a 3 month NICU stay.  She is currently alive and well with no developmental or other concerns. Two early miscarriages between Mr. Gerlich and his wife.   Paternal Family History Father, deceased at 5 yo and 48'0", with a similar body habitus to Mr. Nankervis. He is deceased from complications related to Lupus and also required a heart valve replacement due to calcifications and had glaucoma. Aunt, 69 yo, no information  available. Uncle, 80 yo and 6'0", with Lupus and a similar body habitus to Mr. Stewardson. Grandfather, no information available. Grandmother, 105 yo, with hypertension and diabetes. Her brother, deceased from prostate cancer in his 58s.   Maternal Family History Mother, deceased at 72 yo and 33'10", with alcoholic cirrhosis of the liver and cataracts. One uncle and two aunts in there 48s, alive and well. Both grandparents, in there 60s, alive and well.   Mother's ethnicity: Mixed European Father's ethnicity: African American Consangunity: Denies Please see the Dentist note for additional information  Social History: Lives with spouse and child in [place], currently unemployed  Vitals: Weight: 137.1 lb  Height: 6'1"  Head circumference: 57.5 cm  Arm span: 198.4 cm Arm span/height: 1.07 (elevated) Lower segment: 95.5 Upper/lower segment: 0.94  Genetics Physical Exam:  Constitution: The patient is active and alert  Head: No abnormalities detected in: head, hairline, shape or size    Anterior fontanelle flat: not flat    Anterior fontanelle open: not open    Bitemporal narrowing: forehead not narrow    Frontal bossing: no frontal bossing    Macrocephaly: not macrocephalic    Microcephaly: not microcephalic    Plagiocephaly: not plagiocephalic  Face: No abnormalities detected in: face, midface or shape    Coarse facial features: no coarse facies    Midfacial hypoplasia:  no midfacial hypoplasia (comments: No malar flattening)  Eyes: (comments: Microphthalmia of left eye, able to move left eye, clouding of left eye, reduced visual acuity of right eye)  Ears: No abnormalities detected in: ears    Low-set ears: ears not low set    Posteriorly rotated ears: ears not posteriorly rotated  Nose: No abnormalities detected in: nose, nasal bridge or nasal tip    Bulbous nasal tip: no prominent nasal tip    Columella below nares: no columella below nares     Depressed nasal bridge: no depressed nasal bridge    Flat nasal bridge: no flat nasal bridge    Hypoplastic alae nasi: nasal alae not underdeveloped     Upturned nasal tip: non-upturned nasal tip  Mouth: (comments: Normal palate, increased space between upper central incisors, some crowding of lower teeth)  Neck: No abnormalities detected in: neck    Cysts: no cysts    Pits: no pits in neck    Redundant nuchal skin: no redundant neck skin    Webbing: no webbed neck  Chest: (comments: Asymmetry, with protrusion of left chest)  Cardiac: No abnormalities detected in: cardiovascular system    Abnormal distal perfusion: normal distal perfusion    Irregular rate: heart rate regular    Irregular rhythm: regular rhythm    Murmur: no murmur  Lungs: No abnormalities detected in: pulmonary system, bilateral auscultation or effort  Abdomen: No abnormalities detected in: abdomen or appearance    Abnormal umbilicus: normal umbilicus    Diastasis recti: no diastasis recti    Distended abdomen: no distension    Hepatosplenomegaly: no hepatosplenomegaly    Umbilical hernia: no umbilical hernia  Spine:    Scoliosis: scoliosis Scoliosis:    Location: thoracolumbar (comments: Carries right hip and shoulder higher than left)  Neurological: No abnormalities detected in: neurological system, deep tendon reflexes, antigravity movement of extremities, strength, facial movement or tone    Hypertonia: not hypertonic    Hypotonia: not hypotonic  Genitourinary: not assessed  Hair, Nails, and Skin: (comments: Lumbar striae, slightly stretchy, no piezogenic papules, some thin scars)  Extremities: (comments: Genu varus, increased flexibility of hands/fingers and feet, flat feet with ankle pronation, normal elbow extension)  Hands and Feet: (comments: Positive wrist sign, borderline thumb sign)   [place] Haldeman-Englert, MD [facility] Date: 03/25/2024 Time: 1430   Total  time spent: 65 minutes Time spent includes face to face and non-face to face care for the patient on the date of this encounter (history and physical, genetic counseling, coordination of care, data gathering and/or documentation as outlined).  Genetic counselor: Philbert Brave, MS, [facility]

## 2024-03-25 NOTE — Progress Notes (Unsigned)
 GENETIC COUNSELING NEW PATIENT EVALUATION Patient name: Darrell Erickson DOB: 1991-10-03 Age: 33 y.o. MRN: [id number]  Referring Provider/Specialty: Linzie Rickers, PA  Date of Evaluation: 03/25/2024 Chief Complaint/Reason for Referral: Tall habitus   Brief Summary: Darrell Erickson is a 33 y.o. male who presents today for an initial genetics evaluation for multiple medical concerns including retinopathy, tall habitus, arachnodactyly, and various connective tissue concerns. He is accompanied by his wife, Dana Duncan, and daughter at today's visit.  Prior genetic testing has not been performed.   Family History: See pedigree obtained during today's visit under History->Family->Pedigree.  The family history was notable for the following: Daughter, 77 yo, born at [redacted] weeks GA requiring a 3 month NICU stay.  She is currently alive and well with no developmental or other concerns. Two early miscarriages between Mr. Louallen and his wife.  Paternal Family History Father, deceased at 53 yo and 67'0", with a similar body habitus to Mr. Mahaffy. He is deceased from complications related to Lupus and also required a heart valve replacement due to calcifications and had glaucoma. Aunt, 58 yo, no information available. Uncle, 9 yo and 6'0", with Lupus and a similar body habitus to Mr. Gengler. Grandfather, no information available. Grandmother, 44 yo, with hypertension and diabetes. Her brother, deceased from prostate cancer in his 69s.  Maternal Family History Mother, deceased at 79 yo and 76'10", with alcoholic cirrhosis of the liver and cataracts. One uncle and two aunts in there 42s, alive and well. Both grandparents, in there 60s, alive and well.  Mother's ethnicity: Mixed European Father's ethnicity: African American Consangunity: Denies   Prior Genetic testing: None  Genetic Counseling: Edgard HERSCHEL LUKEHART is a 33 y.o. male with several connective tissue related concerns.  Mr. Tonn  was born at [redacted] weeks gestation and respiratory and vision complications including retinopathy of prematurity.  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  His left eye is atrophied and cloudy, with no vision, and his right eye has decreased acuity.  He wears a contact in his right eye and alternates between a prosthesis and patch on his left eye. Mr. Wehri has also been noted to have tall stature with arachnodactyly.  These symptoms in combination with his history of retinal detachment raised concern for Marfan syndrome, or another connective tissue disorder. He had a normal echocardiogram in April 2025.  Mr. Fran reports various other connective tissue related symptoms like chronic joint pain, increased flexibility, abnormal scarring, skin striae, and pneumothorax as a teenager.  Otherwise, he is also being evaluated for Lupus and other autoimmune conditions due to a positive ANA and strong family history of Lupus.  He also has a history of syncope following blood donation, GI bleeding, epididymitis, asthma, ADHD, and bipolar disorder.  He wonders if he may autism spectrum disorder though no formal evaluation has been completed yet.  Mr. Sculthorpe and his partner have a history of two prior miscarriages and their living daughter was premature at 1 weeks, requiring NICU admission for 3 months.  She is currently doing well with typical development.  Mr. Gosey' father was deceased at 32 yo due to complications related to Lupus, kidney failure, glaucoma, and heart valve replacement due to calcification.  He is also reported to have a simialr body habitus to Mr. Dou.  Genetic considerations were reviewed with the family. They are aware that we have over 20,000 genes, each with an important role in the body. All of the  genes are packaged into structures called chromosomes. We have two copies of every chromosome- one that is inherited from each parent- and thus two copies of  every gene. Given Mr. Angers' features, concern for a genetic cause of his symptoms has arisen. If a specific genetic abnormality can be identified, it may help provide further insight into prognosis, management, and recurrence risk.   Gene panel testing involves testing for many genes related to an individual's symptoms simultaneously.  In Mr. Hailu' case, this would include genes related to connective tissue differences and disorders.  Mr. Coopersmith is interested in pursuing this testing today. Verbal consent was provided after possible results (positive, negative, and variant of uncertain significance), cost, and expected timeline were reviewed. A buccal sample was collected today from Mr. Favinger to be sent to [facility] for the Connective Tissue Disorders Panel (92 genes). Results are expected in 2-4 weeks, at which point we will reach out with more information.  Recommendations: [facility] Connective Tissue Disorders Panel. Continue follow-up with other healthcare providers as recommended.  Date: 03/25/2024 Total time spent: 75 minutes Genetic Counselor-only time: 30 minutes  Time spent includes face to face and non-face to face care for the patient on the date of this encounter (history, genetic counseling, coordination of care, data gathering and/or documentation as outlined).   Philbert Brave MS [facility] Certified Genetic Counselor [facility] [employer]

## 2024-03-25 NOTE — Patient Instructions (Signed)
 Lymphadenopathy left submandibular area.  Significantly improved after being on doxycycline .  No appreciable obvious difference on exam today.  Do recommend that you schedule appointment with a dentist.  I gave you 2 locations today that I recommend you trying to schedule.  If unable to see dentist and if after doxycycline  discontinued would consider getting a soft tissue ultrasound of the neck/submandibular area if lymph node enlarges again.  CBC done at hematologist office was reassuring.  Epididymitis clinically improved with the doxycycline .  Continue full course.  If after doxycycline  you have any residual hesitant urine flow or urethral type pain then recommend calling your urologist for follow-up and to see if they could help further extend doxycycline   Protein in urine and ketones in the urine.  I think this reflects dehydration.  Repeating urinalysis today.  If both protein and ketones show then would refer to nephrologist at your request.  If only ketones show then would recommend hydrating well to prevent dehydration.  And will periodically retest kidney function with metabolic panel.  Currently GFR over 100 is very reassuring  Placed allergist referral for intermittent asthma, history of allergic rhinitis and history of anaphylaxis.  Follow-up in 2 weeks or sooner if needed

## 2024-04-01 NOTE — Progress Notes (Signed)
 Assessment 1. Hypermobile joints        Plan/Orders Although he endorses multiple symptoms, thankfully there is no objective and serologic evidence of SLE at this time.  In rheumatology ANA is only typically considered to be positive at 1:80 though some labs still flag 1:40. The ANA panel is further reassurance.  Patient with signs and symptoms of hypermobile joints, Marfan's ruled out by genetics.  Reviewed his lab workup that was unremarkable with ANA 1:40 which is clinically not significant and repeat ANA negative. We do not see any major red flags from exam or labs and recommend observation for now.   Chief Complaint Darrell Erickson is here for ANA 1:40  HPI 33 yo seen for ANA 1:40. Medical history of thrombocytopenia, blindness. Previously evaluated by rheumatology at [facility] in 2024. ANA panel was negative. Repeat ANA was negative in 11/2023.  01/2023 ANA 1:40 cytoplasmic RF neg ESR 1 CRP <1  He is c/o fatigue, weakness, SOB with exertion, dizziness sore throat, chest discomfort and palpitations, rash occurring across cheeks and back, chills, numbness and tingling in the hands and feet that comes and goes.   He states that he has felt tender, enlarged submandibular nodes on the right and left.  He has also noted fullness under both arms and side of his groin.  US  of the thyroid  in February was negative.  He also had a CTA with [facility] last week. This was also negative.  His father had lupus effecting his kidneys.  He has scrotal swelling and seeing urology. He has seen medical genetics and does not meet criteria for Marfan's.  He has arthralgias in ankles, knees, hands, joints. He has seen cardiology for CP and SOB. + rashes. + photosensitivity.  + Raynaud's symptoms  + mucosal ulcers + mouth or eye dryness No history of pleurisy or pericarditis  No history of DVT, MI, or strokes.  No history of hematuria or nephritis. No seizures. History of asthma.  Medical history  significant for severe premature for around 24 weeks with complications of retinal detachment causing blindness of the left eye and poor visual acuity on the right.  He is legally blind. He has left eye prothesis.    Vital Signs BP 134/78 (BP Location: Left arm, Patient Position: Sitting)   Pulse 57   Ht 1.702 m (5' 7)   Wt 63.9 kg (140 lb 12.8 oz)   SpO2 99%   BMI 22.05 kg/m  Physical Exam, New Patient Pt is alert and oriented.  Answers questions appropriately  Eyes: pupils reactive to light and accommodation. There is no conjunctivitis. Mouth: moist moth mucosa normal pharynx.  Sinuses: no sinuses tenderness on palpation. Neck: no abnormal masses visible or palpable. Thyroid  non tender, no enlargement. Heart: normal S1, S2. No S3, no murmur, no thrills. No JVD. Lungs: no dyspnea observed, normal breath sounds on auscultation. Extremities: no cyanosis, no edema, no calves tenderness. Skin: warm, dry. Lymphatic system: no lymphadenopathy in cervical and supraclavicular lymph nodes groups. Neuro: Gait normal. Motor functions intact, sensory functions intact. Musculoskeletal exam: no signs of muscular atrophy, no muscular tenderness on palpation in any muscle group. Hypermobile joints. Long fingers, long arms.  No hot swollen joints. No rashes. No sclerodactylia.  No clinical features of Raynaud's. No digital ulcers.   PMH Past Medical History:  Diagnosis Date  . Bipolar disorder    (CMD)   . Depression   . Headache   . PTSD (post-traumatic stress disorder)     PSH History  reviewed. No pertinent surgical history.  Allergies Iodine, Shellfish containing products, Walnut, Egg, and Amoxicillin  Medications Current Outpatient Medications on File Prior to Visit  Medication Sig Dispense Refill  . EPINEPHrine  (EPIPEN ) 0.3 mg/0.3 mL injection syringe Inject 0.3 mg into the muscle once as needed for anaphylaxis for up to 1 dose. 1 each 3  . Ventolin  HFA 90 mcg/actuation  inhaler INHALE 1 PUFF INTO THE LUNGS EVERY 4 HOURS AS NEEDED FOR WHEEZING 18 g 1  . [DISCONTINUED] lithium 300 mg capsule Take 300 mg by mouth 3 (three) times a day with meals.     No current facility-administered medications on file prior to visit.    Family Hx Family History  Problem Relation Name Age of Onset  . Schizophrenia Mother    . Depression Mother    . Bipolar disorder Mother    . Lupus Father    . Kidney disease Father    . Hypertension Father    . Hyperlipidemia Father    . Lupus Maternal Uncle    . Heart disease Maternal Grandfather    . Hyperlipidemia Maternal Grandfather    . Ankylosing spondylitis Neg Hx    . Gout Neg Hx      Social Hx  Social History   Tobacco Use  . Smoking status: Never  . Smokeless tobacco: Never  Substance Use Topics  . Alcohol use: Yes    Alcohol/week: 3.0 standard drinks of alcohol  . Drug use: Yes    Types: Marijuana

## 2024-04-02 ENCOUNTER — Ambulatory Visit (INDEPENDENT_AMBULATORY_CARE_PROVIDER_SITE_OTHER): Admitting: Podiatry

## 2024-04-02 DIAGNOSIS — Z91199 Patient's noncompliance with other medical treatment and regimen due to unspecified reason: Secondary | ICD-10-CM

## 2024-04-02 NOTE — Progress Notes (Signed)
 No show

## 2024-04-02 NOTE — Telephone Encounter (Signed)
 Does pt need an appt for form to be signed or do you want to sign form and use charge form

## 2024-04-04 ENCOUNTER — Ambulatory Visit (INDEPENDENT_AMBULATORY_CARE_PROVIDER_SITE_OTHER): Admitting: Medical

## 2024-04-04 VITALS — BP 135/83 | HR 53 | Temp 98.3°F | Resp 16 | Ht 73.0 in | Wt 143.0 lb

## 2024-04-04 DIAGNOSIS — H548 Legal blindness, as defined in USA: Secondary | ICD-10-CM | POA: Diagnosis not present

## 2024-04-04 DIAGNOSIS — J4520 Mild intermittent asthma, uncomplicated: Secondary | ICD-10-CM | POA: Diagnosis not present

## 2024-04-04 NOTE — Progress Notes (Signed)
   Subjective:    Patient ID: Arlena Lacrosse, male    DOB: [birth year], 33 y.o.   MRN: [id number]  HPI Discussed the use of AI scribe software for clinical note transcription with the patient, who gave verbal consent to proceed.  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 Fill out paperwork of [employer] and form from [employer] so service won't be disconnected.  He has statutory blindness, with his left eye removed and legal blindness in the right eye. He previously worked in Teacher, early years/pre for [employer], utilizing accessibility tools such as text-to-speech and a magnifier. A change in management led to the inability of his workplace to accommodate his needs, resulting in his termination. He reapplied for Social Security Disability Insurance (SSDI) and [employer] Income (SSI), both of which have been approved with statutory blindness as the primary diagnosis.  He has a history of asthma, which occasionally flares up and requires nebulizer use. He is concerned about potential electricity shut-offs, which would impact his ability to use the nebulizer during asthma flares.  He has been on Social Security his whole life, reapplied as an adult, and was approved. However, he had to discontinue benefits when his former job income exceeded the threshold. After losing his job due to lack of accommodations, he reapplied for benefits and was approved for both SSDI and SSI, receiving $1,855 per month. He mentions that he will be re-evaluated every seven years to maintain his disability status.           Review of Systems See hpi    Objective:   Physical Exam  General- No acute distress. Pleasant patient. Neck- Full range of motion, no jvd Lungs- Clear, even and unlabored. Heart- regular rate and rhythm. Neurologic- CNII- XII grossly intact.        Assessment & Plan:   Assessment  and Plan    Asthma Mild asthma with occasional exacerbations, managed with intermittent nebulizer use. - Complete [employer] verification form to prevent electricity cutoff due to nebulizer use during asthma flares. 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 - Complete [facility] medical examination form related to disability due to statutory blindness. - Inform Social Security Administration of legally blind status for disability benefits.   Follow up 1 month or sooner if needed        Elany Felix, PA-C   Time spent with patient today was  40 minutes which consisted of chart revdew, discussing  disability diagnosis and filling out 2 moderate complex forms.

## 2024-04-04 NOTE — Patient Instructions (Signed)
 Asthma Mild asthma with occasional exacerbations, managed with intermittent nebulizer use. - Complete [employer] verification form to prevent electricity cutoff due to nebulizer use during asthma flares. 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 - Complete [facility] medical examination form related to disability due to statutory blindness. - Inform Social Security Administration of legally blind status for disability benefits.   Follow up 1 month or sooner if needed

## 2024-04-09 ENCOUNTER — Telehealth: Payer: Self-pay | Admitting: Genetic Counselor

## 2024-04-09 NOTE — Progress Notes (Signed)
 Spoke with Darrell Erickson regarding the results of his recent genetic testing.   Darrell Erickson was seen in the [facility] clinic on 03/25/2024 at 33 yo due to a personal history of connective tissue related concerns.  After evaluation, genetic testing was ordered for Darrell Erickson including a gene panel.   The [facility] Connective Tissue Disorders Panel was negative/normal.  At this time, we have not identified a genetic explanation for Darrell Erickson' symptoms.  No changes to medical management or testing of other family members are recommended based on this result.  We discussed that it is possible for individuals to have differences to their connective tissue without an identified genetic etiology, as is likely the case for Darrell Erickson.  Additionally, it is possible that his symptoms may be, at least in part, attributed to his prematurity.  We also discussed that given his testing results, it is unlikely that his daughter inherited any hereditary connective tissue disorder from Darrell Erickson.  Darrell Erickson expressed understanding of these results and was encouraged to reach out with any further questions.  The test report has been released to the family and is attached to the associated order.   Carolynne Citron, MS [facility] Certified Genetic Counselor

## 2024-04-22 NOTE — Progress Notes (Deleted)
 New Patient Note  RE: Darrell Erickson MRN: [id number] DOB: Nov 14, 1991 Date of Office Visit: 04/23/2024  Consult requested by: Darrell Erickson Primary care provider: Sylvia Everts, Erickson  Chief Complaint: No chief complaint on file.  History of Present Illness: I had the pleasure of seeing Darrell Erickson for initial evaluation at the [facility] on 04/22/2024. He is a 33 y.o. male, who is referred here by Darrell Erickson for the evaluation of asthma, food allergies.  Discussed the use of AI scribe software for clinical note transcription with the patient, who gave verbal consent to proceed.  History of Present Illness             He reports food allergy to ***. The reaction occurred at the age of ***, after he ate *** amount of ***. Symptoms started within *** and was in the form of *** hives, swelling, wheezing, abdominal pain, diarrhea, vomiting. ***Denies any associated cofactors such as exertion, infection, NSAID use, or alcohol consumption. The symptoms lasted for ***. He was evaluated in ED and received ***. Since this episode, he does *** not report other accidental exposures to ***. He does *** not have access to epinephrine  autoinjector and *** needed to use it.   Past work up includes: ***. Dietary History: patient has been eating other foods including ***milk, ***eggs, ***peanut, ***treenuts, ***sesame, ***shellfish, ***fish, ***soy, ***wheat, ***meats, ***fruits and ***vegetables.  He reports reading labels and avoiding *** in diet completely. He tolerates ***baked egg and baked milk products.   He reports symptoms of *** chest tightness, shortness of breath, coughing, wheezing, nocturnal awakenings for *** years. Current medications include *** which help. He reports *** using aerochamber with inhalers. He tried the following inhalers: ***. Main triggers are ***allergies, infections, weather changes, smoke, exercise, pet exposure. In the last  month, frequency of symptoms: ***x/week. Frequency of nocturnal symptoms: ***x/month. Frequency of SABA use: ***x/week. Interference with physical activity: ***. Sleep is ***disturbed. In the last 12 months, emergency room visits/urgent care visits/doctor office visits or hospitalizations due to respiratory issues: ***. In the last 12 months, oral steroids courses: ***. Lifetime history of hospitalization for respiratory issues: ***. Prior intubations: ***. Asthma was diagnosed at age *** by ***. History of pneumonia: ***. He was evaluated by allergist ***pulmonologist in the past. Smoking exposure: ***. Up to date with flu vaccine: ***. Up to date with pneumonia vaccine: ***. Up to date with COVID-19 vaccine: ***. Prior Covid-19 infection: ***. History of reflux: ***.  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 Pt has ventolin . He also has epipen ."  Assessment and Plan: Darrell Erickson is a 33 y.o. male with: ***  Assessment and Plan               No follow-ups on file.  No orders of the defined types were placed in this encounter.  Lab Orders  No laboratory test(s) ordered today    Other allergy screening: Asthma: {Blank single:19197::"yes","no"} Rhino conjunctivitis: {Blank single:19197::"yes","no"} Food allergy: {Blank single:19197::"yes","no"} Medication allergy: {Blank single:19197::"yes","no"} Hymenoptera allergy: {Blank single:19197::"yes","no"} Urticaria: {Blank single:19197::"yes","no"} Eczema:{Blank single:19197::"yes","no"} History of recurrent infections suggestive of immunodeficency: {Blank single:19197::"yes","no"}  Diagnostics: Spirometry:  Tracings reviewed. His effort: {Blank single:19197::"Good reproducible efforts.","It was hard to get consistent efforts and there is a question as to whether this reflects a maximal maneuver.","Poor effort, data can not be interpreted."} FVC: ***L FEV1: ***L, ***% predicted FEV1/FVC ratio:  ***% Interpretation: {Blank single:19197::"Spirometry consistent with  mild obstructive disease","Spirometry consistent with moderate obstructive disease","Spirometry consistent with severe obstructive disease","Spirometry consistent with possible restrictive disease","Spirometry consistent with mixed obstructive and restrictive disease","Spirometry uninterpretable due to technique","Spirometry consistent with normal pattern","No overt abnormalities noted given today's efforts"}.  Please see scanned spirometry results for details.  Skin Testing: {Blank single:19197::"Select foods","Environmental allergy panel","Environmental allergy panel and select foods","Food allergy panel","None","Deferred due to recent antihistamines use"}. *** Results discussed with patient/family.   Past Medical History: Patient Active Problem List   Diagnosis Date Noted  . 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 Ectopia lentis 03/25/2024  . Epididymitis, left 03/20/2024  . Left varicocele 03/20/2024  . Lymphadenopathy 07/10/2023  . Syncope 07/10/2023  . 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 Asthma 06/05/2023  . Toenail fungus 06/05/2023  . Positive ANA (antinuclear antibody) 06/05/2023  . BRBPR (bright red blood per rectum) 06/05/2023  . Legally blind in right eye, as defined in [place]  05/22/2023  . Bipolar I disorder (HCC) 11/29/2018  . Low weight for height 11/29/2018  . Mild intermittent asthma 11/29/2018  . Multiple food allergies 11/29/2018  . Plantar wart, right foot 11/29/2018  . Thrombocytopenia (HCC) 11/29/2018   Past Medical History:  Diagnosis Date  . Allergy   . Asthma   . Family history of heart murmur    childhood  . H/O hernia repair    As newborn  . Premature birth    Was born at ~24 weeks  . Retinal detachment    as a newborn  . Scalp cyst 2006   Past Surgical History: Past Surgical History:  Procedure Laterality Date  . CYSTECTOMY  2006   scalp   Medication List:  Current  Outpatient Medications  Medication Sig Dispense Refill  . EPINEPHrine  0.3 mg/0.3 mL IJ SOAJ injection Inject 0.3 mg into the muscle as needed for anaphylaxis. 1 each 1  . VENTOLIN  HFA 108 (90 Base) MCG/ACT inhaler Inhale 2 puffs into the lungs every 6 (six) hours as needed for wheezing or shortness of breath. 18 g 5   No current facility-administered medications for this visit.   Allergies: Allergies  Allergen Reactions  . Iodine Hives and Itching  . Shellfish-Derived Products Anaphylaxis and Itching  . Walnut Anaphylaxis and Shortness Of Breath    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 No issues eating peanuts.  . Amoxicillin Hives and Rash  . Egg-Derived Products Nausea And Vomiting   Social History: Social History   Socioeconomic History  . Marital status: Married    Spouse name: 1  . Number of children: Not on file  . Years of education: Not on file  . Highest education level: Some college, no degree  Occupational History  . 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  Tobacco Use  . Smoking status: Never    Passive exposure: Never  . Smokeless tobacco: Never  Vaping Use  . Vaping status: Never Used  Substance and Sexual Activity  . Alcohol use: Yes    Alcohol/week: 2.0 standard drinks of alcohol    Types: 2 Cans of beer per week    Comment: recently 2 large bottle at night.  . Drug use: Yes    Types: Marijuana  . Sexual activity: Yes  Other Topics Concern  . Not on file  Social History Narrative  . Not on file   Social Drivers of Health   Financial Resource Strain: Patient Declined (12/15/2023)   Overall Financial Resource Strain (CARDIA)   . Difficulty of Paying Living Expenses: Patient declined  Food Insecurity: Low  Risk  (03/14/2024)   Received from [facility]   Hunger Vital Sign   . Worried About Programme researcher, broadcasting/film/video in the Last Year: Never true   . Ran Out of Food in the Last Year: Never true  Transportation Needs: No Transportation Needs (03/14/2024)   Received from [employer]   . In the past 12 months, has lack of reliable transportation kept you from medical appointments, meetings, work or from getting things needed for daily living? : No  Physical Activity: Unknown (12/15/2023)   Exercise Vital Sign   . Days of Exercise per Week: 0 days   . Minutes of Exercise per Session: Not on file  Stress: Stress Concern Present (12/15/2023)   [employer] of Occupational Health - Occupational Stress Questionnaire   . Feeling of Stress : Rather much  Social Connections: Moderately Isolated (12/15/2023)   Social Connection and Isolation Panel [NHANES]   . Frequency of Communication with Friends and Family: Three times a week   . Frequency of Social Gatherings with Friends and Family: Once a week   . Attends Religious Services: Never   . Active Member of Clubs or Organizations: No   . Attends Banker Meetings: Not on file   . Marital Status: Married   Lives in a ***. Smoking: *** Occupation: ***  Environmental HistorySurveyor, minerals in the house: Copywriter, advertising in the family room: {Blank single:19197::"yes","no"} Carpet in the bedroom: {Blank single:19197::"yes","no"} Heating: {Blank single:19197::"electric","gas","heat pump"} Cooling: {Blank single:19197::"central","window","heat pump"} Pet: {Blank single:19197::"yes ***","no"}  Family History: Family History  Problem Relation Age of Onset  . Cataracts Mother   . Cirrhosis Mother        EtOH  . Bipolar disorder Mother   . Schizophrenia Mother   . Glaucoma Father   . Heart disease Father        Valve replacement Calcified  . Kidney failure Father   . Lupus Father   . Hypertension Paternal Uncle   . Colon cancer Paternal Uncle   . Hypertension Paternal Uncle   . Colon cancer Paternal Uncle   . Diabetes Paternal Grandmother   . Hypertension Paternal Grandmother   . Premature birth Daughter        30 wks PPROM; emergency  c-section NICU for 3 months  . Lupus Paternal Uncle   . Prostate cancer Other 40 - 49  . Stomach cancer Neg Hx   . Rectal cancer Neg Hx   . Liver cancer Neg Hx   . Pancreatic cancer Neg Hx    Problem                               Relation Asthma                                   *** Eczema                                *** Food allergy                          *** Allergic rhino conjunctivitis     ***  Review of Systems  Constitutional:  Negative for appetite change, chills, fever and unexpected weight change.  HENT:  Negative for  congestion and rhinorrhea.   Eyes:  Negative for itching.  Respiratory:  Negative for cough, chest tightness, shortness of breath and wheezing.   Cardiovascular:  Negative for chest pain.  Gastrointestinal:  Negative for abdominal pain.  Genitourinary:  Negative for difficulty urinating.  Skin:  Negative for rash.  Neurological:  Negative for headaches.   Objective: There were no vitals taken for this visit. There is no height or weight on file to calculate BMI. Physical Exam Vitals and nursing note reviewed.  Constitutional:      Appearance: Normal appearance. He is well-developed.  HENT:     Head: Normocephalic and atraumatic.     Right Ear: Tympanic membrane and external ear normal.     Left Ear: Tympanic membrane and external ear normal.     Nose: Nose normal.     Mouth/Throat:     Mouth: Mucous membranes are moist.     Pharynx: Oropharynx is clear.  Eyes:     Conjunctiva/sclera: Conjunctivae normal.  Cardiovascular:     Rate and Rhythm: Normal rate and regular rhythm.     Heart sounds: Normal heart sounds. No murmur heard.    No friction rub. No gallop.  Pulmonary:     Effort: Pulmonary effort is normal.     Breath sounds: Normal breath sounds. No wheezing, rhonchi or rales.  Musculoskeletal:     Cervical back: Neck supple.  Skin:    General: Skin is warm.     Findings: No rash.  Neurological:     Mental Status: He is alert and  oriented to person, place, and time.  Psychiatric:        Behavior: Behavior normal.  The plan was reviewed with the patient/family, and all questions/concerned were addressed.  It was my pleasure to see Bryndon today and participate in his care. Please feel free to contact me with any questions or concerns.  Sincerely,  Eudelia Hero, DO Allergy & Immunology  [facility]  [facility] office: [phone] [facility] office: [phone]

## 2024-04-23 ENCOUNTER — Ambulatory Visit: Payer: Self-pay | Admitting: Allergy

## 2024-05-02 ENCOUNTER — Ambulatory Visit: Admitting: Urology

## 2024-05-02 NOTE — Progress Notes (Deleted)
 Assessment: 1. Scrotal pain   2. Epididymitis, left   3. Left varicocele     Plan: Doxycycline  100 mg BID x 10 days.  Rx sent. His lower urinary tract symptoms are improving.  Will monitor at this time. Return to office in 6 weeks.  Chief Complaint:  No chief complaint on file.   History of Present Illness:  Darrell Erickson is a 33 y.o. male who is seen for further evaluation of scrotal pain.   At his initial visit in April 2025, he reported intermittent pain in the left scrotum for approximately 1 year.  He also reported occasional scrotal swelling.  He had a recent increase in the severity of the pain necessitating evaluation. Scrotal ultrasound from 03/05/2024 showed normal testes bilaterally and a left varicocele. PSA 0.71 Urine culture:  <10K colonies  He also had noted an increase in his lower urinary tract symptoms with straining to void and some dysuria.  He had right sided abdominal pain with radiation to the right flank area, typically after eating.  His urinary symptoms have improved within the week prior to his visit.  IPSS = 25/5.  No history of kidney stones or UTIs.  His exam was consistent with left epididymitis.  He was treated with doxycycline  x 10 days.  Portions of the above documentation were copied from a prior visit for review purposes only.  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 No issues eating peanuts.    Amoxicillin Hives and Rash   Egg-Derived Products Nausea And Vomiting    Family History:  Family History  Problem Relation Age of Onset   Cataracts Mother    Cirrhosis Mother        EtOH   Bipolar disorder Mother    Schizophrenia Mother    Glaucoma Father    Heart disease Father        Valve replacement Calcified   Kidney failure Father    Lupus Father    Hypertension Paternal Uncle    Colon cancer Paternal Uncle    Hypertension Paternal Uncle    Colon cancer Paternal Uncle    Diabetes Paternal Grandmother    Hypertension Paternal Grandmother    Premature birth Daughter        65 wks PPROM; emergency c-section NICU for 3 months   Lupus Paternal Uncle    Prostate cancer Other 40 - 49   Stomach cancer Neg Hx    Rectal cancer Neg Hx    Liver cancer Neg Hx    Pancreatic cancer Neg Hx     Social History:  Social History   Tobacco Use   Smoking status: Never    Passive exposure: Never   Smokeless tobacco: Never  Vaping Use   Vaping status: Never Used  Substance Use Topics  Alcohol use: Yes    Alcohol/week: 2.0 standard drinks of alcohol    Types: 2 Cans of beer per week    Comment: recently 2 large bottle at night.   Drug use: Yes    Types: Marijuana    ROS: Constitutional:  Negative for fever, chills, weight loss CV: Negative for chest pain, previous MI, hypertension Respiratory:  Negative for shortness of breath, wheezing, sleep apnea, frequent cough GI:  Negative for nausea, vomiting, bloody stool, GERD  Physical exam: There were no vitals taken for this visit. GENERAL APPEARANCE:  Well appearing, well developed, well nourished, NAD HEENT:  Atraumatic, normocephalic, oropharynx clear NECK:  Supple without lymphadenopathy or thyromegaly ABDOMEN:  Soft, non-tender, no masses EXTREMITIES:  Moves all extremities well, without clubbing, cyanosis, or edema NEUROLOGIC:  Alert and oriented x 3, normal gait, CN II-XII grossly intact MENTAL STATUS:   appropriate BACK:  Non-tender to palpation, No CVAT SKIN:  Warm, dry, and intact    Results: U/A:

## 2024-05-16 ENCOUNTER — Ambulatory Visit: Admitting: Urology

## 2024-05-16 ENCOUNTER — Encounter: Payer: Self-pay | Admitting: Urology

## 2024-05-16 VITALS — BP 135/84 | HR 56 | Ht 72.0 in | Wt 140.0 lb

## 2024-05-16 DIAGNOSIS — N5082 Scrotal pain: Secondary | ICD-10-CM

## 2024-05-16 DIAGNOSIS — N451 Epididymitis: Secondary | ICD-10-CM

## 2024-05-16 DIAGNOSIS — R399 Unspecified symptoms and signs involving the genitourinary system: Secondary | ICD-10-CM

## 2024-05-16 DIAGNOSIS — N5089 Other specified disorders of the male genital organs: Secondary | ICD-10-CM

## 2024-05-16 LAB — URINALYSIS, ROUTINE W REFLEX MICROSCOPIC
Bilirubin, UA: NEGATIVE
Glucose, UA: NEGATIVE
Leukocytes,UA: NEGATIVE
Nitrite, UA: NEGATIVE
RBC, UA: NEGATIVE
Specific Gravity, UA: 1.025 (ref 1.005–1.030)
Urobilinogen, Ur: 1 mg/dL (ref 0.2–1.0)
pH, UA: 6 (ref 5.0–7.5)

## 2024-05-16 LAB — MICROSCOPIC EXAMINATION

## 2024-05-16 LAB — BLADDER SCAN AMB NON-IMAGING: Scan Result: 10

## 2024-05-16 MED ORDER — ALFUZOSIN HCL ER 10 MG PO TB24: 10.0000 mg | ORAL_TABLET | Freq: Every day | ORAL | 11 refills | Status: DC

## 2024-05-16 NOTE — Progress Notes (Signed)
 Assessment: 1. Lower urinary tract symptoms (LUTS)   2. Epididymitis, left     Plan: Trial of alfuzosin for lower urinary tract symptoms. Return to office in 6 weeks.  Chief Complaint:  Chief Complaint  Patient presents with   Epididymitis    History of Present Illness:  Darrell Erickson is a 33 y.o. male who is seen for further evaluation of left epididymitis and lower urinary tract symptoms.   At his initial visit in April 2025, he reported intermittent pain in the left scrotum for approximately 1 year.  He also reported occasional scrotal swelling.  He had a recent increase in the severity of the pain necessitating evaluation. Scrotal ultrasound from 03/05/2024 showed normal testes bilaterally and a left varicocele. PSA 0.71 Urine culture:  <10K colonies  He also had noted an increase in his lower urinary tract symptoms with straining to void and some dysuria.  He had right sided abdominal pain with radiation to the right flank area, typically after eating.  His urinary symptoms improved within the week prior to his visit.  IPSS = 25/5. PVR = 1 mL  No history of kidney stones or UTIs.  His exam was consistent with left epididymitis.  He was treated with doxycycline  x 10 days.  He returns today for follow-up.  He reports that his scrotal and penile pain have improved.  He continues to have lower urinary tract symptoms with frequency, incomplete emptying, weak stream, and straining. IPSS = 22/6. He is also reporting numbness of the skin in his periumbilical area.  Portions of the above documentation were copied from a prior visit for review purposes only.  Past Medical History:  Past Medical History:  Diagnosis Date   Allergy    Asthma    Family history of heart murmur    childhood   H/O hernia repair    As newborn   Premature birth    Was born at ~24 weeks   Retinal detachment    as a newborn   Scalp cyst 2006    Past Surgical History:  Past Surgical History:   Procedure Laterality Date   CYSTECTOMY  2006   scalp    Allergies:  Allergies  Allergen Reactions   Iodine Hives and Itching   Shellfish-Derived Products Anaphylaxis and Itching   Walnut Anaphylaxis and Shortness Of Breath    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 No issues eating peanuts.   Amoxicillin Hives and Rash   Egg-Derived Products Nausea And Vomiting    Family History:  Family History  Problem Relation Age of Onset   Cataracts Mother    Cirrhosis Mother        EtOH   Bipolar disorder Mother    Schizophrenia Mother    Glaucoma Father    Heart disease Father        Valve replacement Calcified   Kidney failure Father    Lupus Father    Hypertension Paternal Uncle    Colon cancer Paternal Uncle    Hypertension Paternal Uncle    Colon cancer Paternal Uncle    Diabetes Paternal Grandmother    Hypertension Paternal Grandmother    Premature birth Daughter        95 wks PPROM; emergency c-section NICU for 3 months   Lupus Paternal Uncle    Prostate cancer Other 40 - 49   Stomach cancer Neg Hx    Rectal cancer Neg Hx    Liver cancer Neg Hx    Pancreatic cancer Neg  Hx     Social History:  Social History   Tobacco Use   Smoking status: Never    Passive exposure: Never   Smokeless tobacco: Never  Vaping Use   Vaping status: Never Used  Substance Use Topics   Alcohol use: Yes    Alcohol/week: 2.0 standard drinks of alcohol    Types: 2 Cans of beer per week    Comment: recently 2 large bottle at night.   Drug use: Yes    Types: Marijuana    ROS: Constitutional:  Negative for fever, chills, weight loss CV: Negative for chest pain, previous MI, hypertension Respiratory:  Negative for shortness of breath, wheezing, sleep apnea, frequent cough GI:  Negative for nausea, vomiting, bloody stool, GERD  Physical exam: BP 135/84   Pulse (!) 56   Ht 6' (1.829 m)   Wt 140 lb (63.5 kg)   BMI 18.99 kg/m  GENERAL APPEARANCE:  Well appearing, well developed, well  nourished, NAD HEENT:  Atraumatic, normocephalic, oropharynx clear NECK:  Supple without lymphadenopathy or thyromegaly ABDOMEN:  Soft, non-tender, no masses; no umbilical hernia palpated EXTREMITIES:  Moves all extremities well, without clubbing, cyanosis, or edema NEUROLOGIC:  Alert and oriented x 3, normal gait, CN II-XII grossly intact MENTAL STATUS:  appropriate BACK:  Non-tender to palpation, No CVAT SKIN:  Warm, dry, and intact    Results: U/A: 0-5 WBC, 0-2 RBC  PVR = 10 ml

## 2024-05-21 ENCOUNTER — Ambulatory Visit: Admitting: Medical Genetics

## 2024-05-28 ENCOUNTER — Ambulatory Visit: Admitting: Medical

## 2024-05-29 ENCOUNTER — Ambulatory Visit: Admitting: Gastroenterology

## 2024-05-29 NOTE — Progress Notes (Deleted)
 SABRA

## 2024-06-12 ENCOUNTER — Ambulatory Visit: Admitting: Gastroenterology

## 2024-06-26 ENCOUNTER — Ambulatory Visit: Admitting: Urology

## 2024-06-26 NOTE — Progress Notes (Deleted)
 Assessment: 1. Lower urinary tract symptoms (LUTS)    Plan: Trial of alfuzosin  for lower urinary tract symptoms. Return to office in 6 weeks.  Chief Complaint:  No chief complaint on file.   History of Present Illness:  Darrell Erickson is a 33 y.o. male who is seen for further evaluation of  lower urinary tract symptoms.    At his initial visit in April 2025, he reported intermittent pain in the left scrotum for approximately 1 year.  He also reported occasional scrotal swelling.  He had a recent increase in the severity of the pain necessitating evaluation. Scrotal ultrasound from 03/05/2024 showed normal testes bilaterally and a left varicocele. PSA 0.71 Urine culture:  <10K colonies  He also had noted an increase in his lower urinary tract symptoms with straining to void and some dysuria.  He had right sided abdominal pain with radiation to the right flank area, typically after eating.  His urinary symptoms improved within the week prior to his visit.  IPSS = 25/5. PVR = 1 mL  No history of kidney stones or UTIs.  His exam was consistent with left epididymitis.  He was treated with doxycycline  x 10 days.  At his visit on 05/16/2024, he reported that his scrotal and penile pain were improved.  He continued to have lower urinary tract symptoms with frequency, incomplete emptying, weak stream, and straining. IPSS = 22/6. PVR = 10 mL He was also reporting numbness of the skin in his periumbilical area. He was started on alfuzosin  10 mg daily for his lower urinary tract symptoms.  Portions of the above documentation were copied from a prior visit for review purposes only.  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 No issues eating peanuts.   Amoxicillin Hives and Rash   Egg-Derived Products Nausea And Vomiting    Family History:  Family History  Problem Relation Age of Onset   Cataracts Mother    Cirrhosis Mother        EtOH   Bipolar disorder Mother    Schizophrenia Mother    Glaucoma Father    Heart disease Father        Valve replacement Calcified   Kidney failure Father    Lupus Father    Hypertension Paternal Uncle    Colon cancer Paternal Uncle    Hypertension Paternal Uncle    Colon cancer Paternal Uncle    Diabetes Paternal Grandmother    Hypertension Paternal Grandmother    Premature birth Daughter        30 wks PPROM; emergency c-section NICU for 3 months   Lupus Paternal Uncle    Prostate cancer Other 40 - 49   Stomach cancer Neg Hx    Rectal cancer Neg Hx    Liver cancer Neg Hx  Pancreatic cancer Neg Hx     Social History:  Social History   Tobacco Use   Smoking status: Never    Passive exposure: Never   Smokeless tobacco: Never  Vaping Use   Vaping status: Never Used  Substance Use Topics   Alcohol use: Yes    Alcohol/week: 2.0 standard drinks of alcohol    Types: 2 Cans of beer per week    Comment: recently 2 large bottle at night.   Drug use: Yes    Types: Marijuana    ROS: Constitutional:  Negative for fever, chills, weight loss CV: Negative for chest pain, previous MI, hypertension Respiratory:  Negative for shortness of breath, wheezing, sleep apnea, frequent cough GI:  Negative for nausea, vomiting, bloody stool, GERD  Physical exam: There were no vitals taken for this visit. GENERAL APPEARANCE:  Well appearing, well developed, well nourished, NAD HEENT:   Atraumatic, normocephalic, oropharynx clear NECK:  Supple without lymphadenopathy or thyromegaly ABDOMEN:  Soft, non-tender, no masses EXTREMITIES:  Moves all extremities well, without clubbing, cyanosis, or edema NEUROLOGIC:  Alert and oriented x 3, normal gait, CN II-XII grossly intact MENTAL STATUS:  appropriate BACK:  Non-tender to palpation, No CVAT SKIN:  Warm, dry, and intact    Results: U/A:

## 2024-06-27 ENCOUNTER — Ambulatory Visit: Admitting: Urology

## 2024-07-04 ENCOUNTER — Other Ambulatory Visit: Payer: Self-pay | Admitting: Medical Genetics

## 2024-07-07 NOTE — ED Provider Notes (Signed)
 [facility]  ED Provider Note  Darrell Erickson 33 y.o. male DOB: May 15, 1991 MRN: [id number] History   Chief Complaint  Patient presents with  . Abdominal Pain    Patient presents with generalized abdominal pain x 1 month.  Patient reports nausea upon waking today and states has been having diarrhea for two days   33 year old male presenting to the ED with abdominal pain which is generalized throughout his abdomen.  This has been ongoing for several months.  Orts he had been constipated and had been having trouble having a bowel movement.  Now for 2 days patient has had diarrhea.  Today he woke up and felt nauseated.  Patient states that he does have some sensory defects on his abdomen from time to time and also a sharp suiting pain.  These are mobile and are not always in the same spot.       Past Medical History:  Diagnosis Date  . Asthma (*)   . Blind     History reviewed. No pertinent surgical history.  Social History   Substance and Sexual Activity  Alcohol Use Yes  . Alcohol/week: 2.0 - 6.0 standard drinks of alcohol  . Types: 2 - 6 Standard drinks or equivalent per week   Tobacco Use History[1] E-Cigarettes  . Vaping Use Never User   . Start Date    . Cartridges/Day    . Quit Date     Social History   Substance and Sexual Activity  Drug Use Yes  . Types: Marijuana         Allergies[2]  Discharge Medication List as of 07/07/2024  8:24 AM     CONTINUE these medications which have NOT CHANGED   Details  albuterol  sulfate HFA (PROAIR  HFA) 108 (90 Base) MCG/ACT inhaler Inhale two puffs into the lungs every 4 (four) hours as needed for Wheezing or Shortness of Breath., Starting Tue 01/30/2023, Normal    EPINEPHrine  (AUVI-Q ,EPIPEN ) 0.3 mg/0.3 mL injection Inject 0.3 mLs (0.3 mg dose) into the muscle as needed., Starting Fri 12/28/2023, Historical Med    ibuprofen (ADVIL,MOTRIN) 800 mg tablet Take one tablet (800 mg dose) by mouth  every 8 (eight) hours as needed for up to 10 days., Starting Thu 05/29/2024, Until Wed 06/11/2024 at 2359, Normal        Primary Survey  Primary Survey  Review of Systems   Review of Systems  All other systems reviewed and are negative.   Physical Exam   ED Triage Vitals [07/07/24 0624]  BP 141/77  Heart Rate 80  Resp 18  SpO2 100 %  Temp 97.9 F (36.6 C)    Physical Exam  Nursing note and vitals reviewed. Constitutional: He appears well-developed and well-nourished. He does not appear distressed and no respiratory distress.  HENT:  Head: Normocephalic and atraumatic.  Right Ear: Normal external ear.  Left Ear: Normal external ear.  Nose: Nose normal.  Mouth/Throat: Voice normal.  Eyes: EOM are intact. Conjunctivae are normal. Pupils are equal, round, and reactive to light.  Neck: Normal range of motion and voice normal.  Cardiovascular: Normal rate, regular rhythm, normal heart sounds and intact distal pulses.  No audible murmur.  Pulmonary/Chest: No respiratory distress. Respiratory effort normal and breath sounds normal.  Abdominal: Soft. There is mild generalized abdominal tenderness. There is no guarding and no rebound. Bowel sounds are normal.  Musculoskeletal: Normal range of motion. No obvious deformity noted to extremities.     Cervical back: Normal range  of motion.   Lymphadenopathy:    No cervical adenopathy.  Neurological: He is alert and oriented to person, place, and time. He has normal speech. Cranial nerves intact II through XII. Sensation intact to light touch, bilateral upper and lower extremities. Strength 5/5 bilateral upper and lower extremities.  Skin: Skin is warm. Skin is dry.  Psychiatric: He has a normal mood and affect. His behavior is normal.     ED Course   Lab results:   CBC AND DIFFERENTIAL - Abnormal      Result Value   WBC 6.4     RBC 4.66     HGB 14.1     HCT 42.0     MCV 90.1     MCH 30.3     MCHC 33.6     Plt Ct 145 (*)     RDW SD 42.0     MPV 11.4     NRBC% 0.0     Absolute NRBC Count 0.00     NEUTROPHIL % 43.9     LYMPHOCYTE % 43.7     MONOCYTE % 8.7     Eosinophil % 3.0     BASOPHIL % 0.5     IG% 0.2     ABSOLUTE NEUTROPHIL COUNT 2.82     ABSOLUTE LYMPHOCYTE COUNT 2.80     Absolute Monocyte Count 0.56     Absolute Eosinophil Count 0.19     Absolute Basophil Count 0.03     Absolute Immature Granulocyte Count 0.01    URINALYSIS W/MICRO REFLEX CULTURE - SYMPTOMATIC - Abnormal   Urine Color Yellow     Urine Clarity Clear     Urine Specific Gravity 1.033 (*)    Urine pH 5.5     Urine Protein - Dipstick 10 (*)    Urine Glucose Negative     Urine Ketones Negative     Urine Bilirubin Negative     Urine Blood Negative     Urine Nitrite Negative     Urine Urobilinogen <2     Urine Leukocyte Esterase Negative     UA Microscopic No Micro     Narrative:    Does not meet criteria for reflex to Urine Culture.  COMPREHENSIVE METABOLIC PANEL - Normal   Na 141     Potassium 3.9     Cl 104     CO2 25     AGAP 12     Glucose 98     BUN 18     Creatinine 1.19     Ca 9.1     ALK PHOS 91     T Bili 0.2     Total Protein 6.6     Alb 4.4     GLOBULIN 2.2     ALBUMIN/GLOBULIN RATIO 2.0     BUN/CREAT RATIO 15.1     ALT 21     AST 15     eGFR 83     Comment: Normal GFR (glomerular filtration rate) > 60 mL/min/1.73 meters squared, < 60 may include impaired kidney function. Calculation based on the Chronic Kidney Disease Epidemiology Collaboration (CK-EPI)equation refit without adjustment for race.  LIPASE - Normal   Lipase 41      Imaging:   CT ABDOMEN PELVIS WO IV CONTRAST   Narrative:    CT abdomen and pelvis without contrast:  INDICATION: Abdominal pain  TECHNIQUE: Axial scans were performed through the abdomen and pelvis without contrast. Radiation dose reduction was utilized (automated exposure  control, mA or kV adjustment based on patient size, or iterative image  reconstruction).  COMPARISON: None  FINDINGS:  #  Lung bases: Unremarkable #  Liver: No focal abnormality seen. No biliary dilatation. #  Gallbladder: No radiopaque calculi #  Spleen: Normal. #  Pancreas: Normal #  Adrenal glands: Normal. #  Kidneys and ureters: Normal. #  Retroperitoneum: No mass or adenopathy. #  Aorta: No significant abnormality. #  Bowel and mesentery: There is wall thickening of the stomach which may be artifactual due to under distention versus gastritis.  #  No bowel dilatation. No inflammatory changes seen.   The appendix is not identified.  The colon is poorly evaluated due to the lack of oral contrast and under distention but demonstrates no gross abnormality.   Pelvis: #  No mass or adenopathy. #  No inflammatory changes seen. #  Bladder: No significant abnormality. #    #  Bony structures: No significant abnormality.    Impression:    IMPRESSION:  No renal calculi no hydronephrosis.  No bowel obstruction.  Wall thickening of the stomach which could be artifactual due to under distention versus gastritis.  Electronically Signed by: Marinda Fleming, MD on 07/07/2024 7:12 AM      ECG: ECG Results   None                                                                     Pre-Sedation Procedures  ED Course as of 07/07/24 1400  [facility] Documentation  Mon Jul 07, 2024  [id number] with [facility] tomorrow   Medical Decision Making The patient presents with symptoms consistent with gastritis or distal esophagitis.  The patient has a generally reassuring physical exam, clinical picture and workup as above. Please see ED course.  Laboratory studies were reviewed and showed no leukocytosis or anemia. Electrolytes, LFTs, and lipase were generally within normal limits. CT abdomen pelvis was obtained and is negative. Images were interpreted by myself prior to final radiologist report.   Radiologist final report confirms my interpretation. Considered RUQ ultrasound, however LFTs, alk phos are within normal limits and there is no specific RUQ tenderness or Murphy sign.  Abdomen is not consistent with a surgical abdomen.  I considered a broad differential diagnoses including but not limited to pancreatitis, bowel perforation, perforated gastric ulcer, esophageal varices, esophageal rupture, SBO, LBO, mesenteric ischemia, volvulus, ACS/MI, cholecystitis, cholelithiasis, UGIB, LGIB.   This likely represents reflux, but early peptic ulcer disease cannot be excluded.  The patient has absolutely no evidence at this time for gastrointestinal hemorrhage and denies hematochezia/melena. The patient had good relief with symptomatic treatment here today and is a good candidate for outpatient antacid therapy.  The patient understands that at this time there is no evidence for a more malignant underlying process, but the patient also understands that early in the process of an illness, an initial workup can be falsely reassuring.  Routine discharge counseling was given to the patient and the patient understands that worsening, changing or persistent symptoms should prompt an immediate call or follow up with their primary physician or return for reevaluation.  The importance of appropriate follow up was also discussed with the patient.  More extensive discharge instructions were given  in the patient's discharge paperwork.    Amount and/or Complexity of Data Reviewed Labs: ordered. Decision-making details documented in ED Course. Radiology: ordered and independent interpretation performed. Decision-making details documented in ED Course.  Risk Prescription drug management. Parenteral controlled substances. Decision regarding hospitalization.            Provider Communication  Discharge Medication List as of 07/07/2024  8:24 AM     START taking these medications   Details  pantoprazole  sodium (PROTONIX) 20 mg tablet Take one tablet (20 mg dose) by mouth daily for 15 days., Starting Mon 07/07/2024, Until Tue 07/22/2024, Normal        Discharge Medication List as of 07/07/2024  8:24 AM      Discharge Medication List as of 07/07/2024  8:24 AM      Clinical Impression Final diagnoses:  Acute gastritis without hemorrhage, unspecified gastritis type    ED Disposition     ED Disposition  Discharge   Condition  Stable   Comment  --                 Follow-up Information     [facility].   Contact information: [address] [place]  [address] [phone]                 Electronically signed by:       [1] Social History Tobacco Use  Smoking Status Never  Smokeless Tobacco Never  [2] Allergies Allergen Reactions  . 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 No issues eating peanuts.  . Shellfish Allergy Anaphylaxis  . Amoxicillin Hives  . Eggs Or Egg-Derived Products Hives  . Iodine Rash   Katheryn Sauer [place], DO 07/07/24 1400

## 2024-07-07 NOTE — ED Notes (Signed)
 Pt aware that urine sample is needed.

## 2024-07-08 ENCOUNTER — Other Ambulatory Visit

## 2024-07-08 ENCOUNTER — Ambulatory Visit (INDEPENDENT_AMBULATORY_CARE_PROVIDER_SITE_OTHER): Admitting: Gastroenterology

## 2024-07-08 ENCOUNTER — Encounter: Payer: Self-pay | Admitting: Gastroenterology

## 2024-07-08 VITALS — BP 140/90 | HR 89 | Ht 72.0 in | Wt 142.2 lb

## 2024-07-08 DIAGNOSIS — K625 Hemorrhage of anus and rectum: Secondary | ICD-10-CM

## 2024-07-08 DIAGNOSIS — Z006 Encounter for examination for normal comparison and control in clinical research program: Secondary | ICD-10-CM

## 2024-07-08 DIAGNOSIS — R140 Abdominal distension (gaseous): Secondary | ICD-10-CM

## 2024-07-08 DIAGNOSIS — R933 Abnormal findings on diagnostic imaging of other parts of digestive tract: Secondary | ICD-10-CM

## 2024-07-08 DIAGNOSIS — R1084 Generalized abdominal pain: Secondary | ICD-10-CM

## 2024-07-08 DIAGNOSIS — R194 Change in bowel habit: Secondary | ICD-10-CM | POA: Diagnosis not present

## 2024-07-08 DIAGNOSIS — R110 Nausea: Secondary | ICD-10-CM

## 2024-07-08 MED ORDER — NA SULFATE-K SULFATE-MG SULF 17.5-3.13-1.6 GM/177ML PO SOLN: 1.0000 | Freq: Once | ORAL | 0 refills | Status: AC

## 2024-07-08 NOTE — Progress Notes (Signed)
 Chief Complaint:abdominal pain Primary GI Doctor: Dr. Charlanne  HPI:  Patient is a  33  year old male patient with past medical history of asthma, bipolar disorder, and ADD, who presents for a evaluation of abdominal pain .   Patient last seen in the GI office on 05/22/2023 by Vina, [place] for complaints of blood in stool.  Patient was put on treatment regimen for constipation and recommendations for colonoscopy.  Patient went to the ED yesterday for abdominal pain.  Lab work shows: WBC 6.4, hemoglobin 14.1, platelets 145, UA negative, normal CMP, lipase 41.  CT abdomen pelvis shows no renal calculi.  No bowel obstruction.  Wall thickening of the stomach which could be artifactual due to underdistention versus gastritis.  Patient discharged with pantoprazole 20 mg po daily.    Interval History    Patient presents for evaluation of abdominal pain. He reports the pain started a few months ago. He notes he had two rounds of antibiotics around this time period for mouth infection and UTI. He reports he started to have RLQ abdominal pain that radiated to left lower abdomen then radiated up into the epigastric region. He describes the pain sometimes can feel like a stabbing pain.  He reports eating makes the pain worse and makes him feel full. He typically only eats once per day. He also complains of constipation with bloating. He reports recently having looser stools, but does not feel he empties out. Patient has intermittent nausea, no vomiting. Patient has history of rectal bleeding with recommendations to have colonoscopy, he would like to proceed with colonoscopy now.  Patient has a lot of fatigue. Patient started on vitamin b 12 for b 12 deficiency   He is currently seeing urologist for further evaluation of left epididymitis and lower urinary tract symptoms.    He smokes marijuana daily.  Family history: father with lupus, uncle with prostate CA  Surgical history: hernia repair as infant  Wt  Readings from Last 3 Encounters:  07/08/24 142 lb 4 oz (64.5 kg)  05/16/24 140 lb (63.5 kg)  04/04/24 143 lb (64.9 kg)     Past Medical History:  Diagnosis Date   Allergy    Asthma    Family history of heart murmur    childhood   H/O hernia repair    As newborn   Premature birth    Was born at ~24 weeks   Retinal detachment    as a newborn   Scalp cyst 2006    Past Surgical History:  Procedure Laterality Date   CYSTECTOMY  2006   scalp    Current Outpatient Medications  Medication Sig Dispense Refill   EPINEPHrine  0.3 mg/0.3 mL IJ SOAJ injection Inject 0.3 mg into the muscle as needed for anaphylaxis. 1 each 1   alfuzosin  (UROXATRAL ) 10 MG 24 hr tablet Take 1 tablet (10 mg total) by mouth daily. (Patient not taking: Reported on 07/08/2024) 30 tablet 11   pantoprazole (PROTONIX) 20 MG tablet Take 20 mg by mouth daily. (Patient not taking: Reported on 07/08/2024)     VENTOLIN  HFA 108 (90 Base) MCG/ACT inhaler Inhale 2 puffs into the lungs every 6 (six) hours as needed for wheezing or shortness of breath. 18 g 5   No current facility-administered medications for this visit.    Allergies as of 07/08/2024 - Review Complete 05/16/2024  Allergen Reaction Noted   Iodine Hives and Itching 06/05/2023   Shellfish-derived products Anaphylaxis and Itching 06/04/2014   Walnut Anaphylaxis  and Shortness Of Breath 03/14/2024   Amoxicillin Hives and Rash 10/09/2017   Egg-derived products Nausea And Vomiting 06/04/2014    Family History  Problem Relation Age of Onset   Cataracts Mother    Cirrhosis Mother        EtOH   Bipolar disorder Mother    Schizophrenia Mother    Glaucoma Father    Heart disease Father        Valve replacement Calcified   Kidney failure Father    Lupus Father    Hypertension Paternal Uncle    Colon cancer Paternal Uncle    Hypertension Paternal Uncle    Colon cancer Paternal Uncle    Diabetes Paternal Grandmother    Hypertension Paternal Grandmother     Premature birth Daughter        62 wks PPROM; emergency c-section NICU for 3 months   Lupus Paternal Uncle    Prostate cancer Other 40 - 49   Stomach cancer Neg Hx    Rectal cancer Neg Hx    Liver cancer Neg Hx    Pancreatic cancer Neg Hx     Review of Systems:    Constitutional: No weight loss, fever, chills, weakness or fatigue HEENT: Eyes: No change in vision               Ears, Nose, Throat:  No change in hearing or congestion Skin: No rash or itching Cardiovascular: No chest pain, chest pressure or palpitations   Respiratory: No SOB or cough Gastrointestinal: See HPI and otherwise negative Genitourinary: No dysuria or change in urinary frequency Neurological: No headache, dizziness or syncope Musculoskeletal: No new muscle or joint pain Hematologic: No bleeding or bruising Psychiatric: No history of depression or anxiety    Physical Exam:  Vital signs: BP (!) 140/90 (BP Location: Left Arm, Patient Position: Sitting, Cuff Size: Normal)   Pulse 89   Ht 6' (1.829 m)   Wt 142 lb 4 oz (64.5 kg)   SpO2 99%   BMI 19.29 kg/m   Constitutional:   Pleasant  male appears to be in NAD, Well developed, Well nourished, alert and cooperative Throat: Oral cavity and pharynx without inflammation, swelling or lesion.  Respiratory: Respirations even and unlabored. Lungs clear to auscultation bilaterally.   No wheezes, crackles, or rhonchi.  Cardiovascular: Normal S1, S2. Regular rate and rhythm. No peripheral edema, cyanosis or pallor.  Gastrointestinal:  Soft, nondistended, generalized tenderness. No rebound or guarding. Normal bowel sounds. No appreciable masses or hepatomegaly. Rectal:  Not performed.  Msk:  Symmetrical without gross deformities. Without edema, no deformity or joint abnormality.  Neurologic:  Alert and  oriented x4;  grossly normal neurologically.  Skin:   Dry and intact without significant lesions or rashes.  RELEVANT LABS AND IMAGING: CBC    Latest Ref Rng  & Units 03/14/2024    9:01 AM 03/05/2024    9:07 AM 12/28/2023   11:34 AM  CBC  WBC 4.0 - 10.5 K/uL 10.1  6.8  6.0   Hemoglobin 13.0 - 17.0 g/dL 85.4  85.5  85.6   Hematocrit 39.0 - 52.0 % 42.1  44.0  43.8   Platelets 150 - 400 K/uL 163  143.0  136.0      CMP     Latest Ref Rng & Units 03/14/2024    9:01 AM 12/28/2023   11:34 AM 06/05/2023    2:38 PM  CMP  Glucose 70 - 99 mg/dL 96  88  73  BUN 6 - 20 mg/dL 20  13  10    Creatinine 0.61 - 1.24 mg/dL 8.92  9.03  8.99   Sodium 135 - 145 mmol/L 139  142  142   Potassium 3.5 - 5.1 mmol/L 3.8  4.4  4.3   Chloride 98 - 111 mmol/L 101  106  105   CO2 22 - 32 mmol/L 30  29  32   Calcium 8.9 - 10.3 mg/dL 9.2  9.0  9.8   Total Protein 6.5 - 8.1 g/dL 6.8  6.3  6.8   Total Bilirubin 0.0 - 1.2 mg/dL 0.4  0.5  0.7   Alkaline Phos 38 - 126 U/L 84  74    AST 15 - 41 U/L 11  14  13    ALT 0 - 44 U/L 13  19  20       Lab Results  Component Value Date   TSH 1.55 12/28/2023  01/2023 CRP and ESR normal, ANA positive 07/07/2024 CTAP IMPRESSION:  No renal calculi no hydronephrosis.  No bowel obstruction.  Wall thickening of the stomach which could be artifactual due to under distention versus gastritis.   Assessment: Encounter Diagnoses  Name Primary?   Nausea without vomiting Yes   Altered bowel habits    Rectal bleeding    Generalized abdominal pain    Bloating    Abnormal CT scan, stomach      33 year old male patient who presents with generalized abdominal pain with nausea and bloating.  ED visit yesterday revealed labs that were unremarkable however CT scan showed patient Linzy Laury have inflammation/gastritis in the stomach.  Will go ahead and proceed with upper GI endoscopy in [facility] for further evaluation.  Instructed patient to start the pantoprazole daily with strict GERD diet.  Also recommended patient discontinue marijuana use.  Also complains of issues with altered bowel habits with intermittent episodes of rectal bleeding, recommended patient  have colonoscopy last year however unable to schedule.  Patient would like to schedule today with 2-day prep in [facility].  Patient can use fiber supplementation and over-the-counter MiraLAX as needed.  Plan: -Start pantoprazole 40 mg po daily -Strict GERD diet, no late meals -Recommend Marajuana cessation -Recommend high fiber diet -Start Benefiber OTC -Can use Miralax po daily prn for constipation -Schedule EGD in [facility] with Dr. Charlanne. The risks and benefits of EGD with possible biopsies and esophageal dilation were discussed with the patient who agrees to proceed. -Schedule for a colonoscopy in [facility] with Dr. Charlanne. The risks and benefits of colonoscopy with possible polypectomy / biopsies were discussed and the patient agrees to proceed.    Thank you for the courtesy of this consult. Please call me with any questions or concerns.   Terria Deschepper, FNP-C [facility] 07/08/2024, 4:22 PM  Cc: Saguier, Edward, PA-C

## 2024-07-08 NOTE — Patient Instructions (Addendum)
 Cannabis Hyperemesis Syndrome Marijuana cessation   Gastritis Start Pantoprazole 40 mg po daily- take 1 tablet 30-45 minutes before breakfast GERD diet  Constipation Recommend high fiber diet Start Benefiber OTC Can use Miralax prn for constipation  We have sent the following medications to your pharmacy for you to pick up at your convenience: SUPREP  You have been scheduled for an endoscopy and colonoscopy. Please follow the written instructions given to you at your visit today.  If you use inhalers (even only as needed), please bring them with you on the day of your procedure.  DO NOT TAKE 7 DAYS PRIOR TO TEST- Trulicity (dulaglutide) Ozempic, Wegovy (semaglutide) Mounjaro (tirzepatide) Bydureon Bcise (exanatide extended release)  DO NOT TAKE 1 DAY PRIOR TO YOUR TEST Rybelsus (semaglutide) Adlyxin (lixisenatide) Victoza (liraglutide) Byetta (exanatide) ___________________________________________________________________________ Due to recent changes in healthcare laws, you Erickson see the results of your imaging and laboratory studies on MyChart before your provider has had a chance to review them.  We understand that in some cases there Erickson be results that are confusing or concerning to you. Not all laboratory results come back in the same time frame and the provider Erickson be waiting for multiple results in order to interpret others.  Please give us  48 hours in order for your provider to thoroughly review all the results before contacting the office for clarification of your results.   _______________________________________________________  If your blood pressure at your visit was 140/90 or greater, please contact your primary care physician to follow up on this.  _______________________________________________________  If you are age 65 or older, your body mass index should be between 23-30. Your Body mass index is 19.29 kg/m. If this is out of the aforementioned range listed,  please consider follow up with your Primary Care Provider.  If you are age 17 or younger, your body mass index should be between 19-25. Your Body mass index is 19.29 kg/m. If this is out of the aformentioned range listed, please consider follow up with your Primary Care Provider.   ________________________________________________________  The [facility] providers would like to encourage you to use MYCHART to communicate with providers for non-urgent requests or questions.  Due to long hold times on the telephone, sending your provider a message by [facility] Erickson be a faster and more efficient way to get a response.  Please allow 48 business hours for a response.  Please remember that this is for non-urgent requests.  _______________________________________________________  [facility] is using a team-based approach to care.  Your team is made up of your doctor and two to three APPS. Our APPS (Nurse Practitioners and Physician Assistants) work with your physician to ensure care continuity for you. They are fully qualified to address your health concerns and develop a treatment plan. They communicate directly with your gastroenterologist to care for you. Seeing the Advanced Practice Practitioners on your physician's team can help you by facilitating care more promptly, often allowing for earlier appointments, access to diagnostic testing, procedures, and other specialty referrals.   Thank you for trusting me with your gastrointestinal care. Darrell May, FNP-C

## 2024-07-09 ENCOUNTER — Telehealth: Payer: Self-pay | Admitting: Gastroenterology

## 2024-07-09 NOTE — Telephone Encounter (Signed)
 Patient was advised and verbalized understanding.

## 2024-07-09 NOTE — Progress Notes (Signed)
 Agree with assessment/plan.  Edman Circle, MD [facility] [phone]

## 2024-07-09 NOTE — Telephone Encounter (Signed)
 Patient called requesting to speak with someone regarding message below. Please advise.

## 2024-07-09 NOTE — Telephone Encounter (Signed)
 Inbound call from patient's requesting to know if he should take two 20 mg pantoprazole capsule today or if a prescription for the 40 mg will be called in. Please advise, thank you

## 2024-07-11 ENCOUNTER — Ambulatory Visit (HOSPITAL_BASED_OUTPATIENT_CLINIC_OR_DEPARTMENT_OTHER): Admitting: Cardiology

## 2024-07-17 ENCOUNTER — Ambulatory Visit: Admitting: Urology

## 2024-07-17 NOTE — Progress Notes (Deleted)
 Assessment: 1. Lower urinary tract symptoms (LUTS)     Plan: Trial of alfuzosin  for lower urinary tract symptoms. Return to office in 6 weeks.  Chief Complaint:  No chief complaint on file.   History of Present Illness:  Darrell Erickson is a 33 y.o. male who is seen for further evaluation of  lower urinary tract symptoms.    At his initial visit in April 2025, he reported intermittent pain in the left scrotum for approximately 1 year.  He also reported occasional scrotal swelling.  He had a recent increase in the severity of the pain necessitating evaluation. Scrotal ultrasound from 03/05/2024 showed normal testes bilaterally and a left varicocele. PSA 0.71 Urine culture:  <10K colonies  He also had noted an increase in his lower urinary tract symptoms with straining to void and some dysuria.  He had right sided abdominal pain with radiation to the right flank area, typically after eating.  His urinary symptoms improved within the week prior to his visit.  IPSS = 25/5. PVR = 1 mL  No history of kidney stones or UTIs.  His exam was consistent with left epididymitis.  He was treated with doxycycline  x 10 days.  At his visit on 05/16/2024, he reported that his scrotal and penile pain had improved.  He continued to have lower urinary tract symptoms with frequency, incomplete emptying, weak stream, and straining. IPSS = 22/6. PVR = 10 mL He was also reporting numbness of the skin in his periumbilical area. He was started on alfuzosin  10 mg daily for his lower urinary tract symptoms.  Portions of the above documentation were copied from a prior visit for review purposes only.  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 No issues eating peanuts.   Amoxicillin Hives and Rash   Egg-Derived Products Nausea And Vomiting    Family History:  Family History  Problem Relation Age of Onset   Cataracts Mother    Cirrhosis Mother        EtOH   Bipolar disorder Mother    Schizophrenia Mother    Glaucoma Father    Heart disease Father        Valve replacement Calcified   Kidney failure Father    Lupus Father    Hypertension Paternal Uncle    Colon cancer Paternal Uncle    Hypertension Paternal Uncle    Colon cancer Paternal Uncle    Diabetes Paternal Grandmother    Hypertension Paternal Grandmother    Premature birth Daughter        38 wks PPROM; emergency c-section NICU for 3 months   Lupus Paternal Uncle    Prostate cancer Other 40 - 49   Stomach cancer Neg Hx    Rectal cancer Neg Hx    Liver cancer Neg Hx  Pancreatic cancer Neg Hx     Social History:  Social History   Tobacco Use   Smoking status: Never    Passive exposure: Never   Smokeless tobacco: Never  Vaping Use   Vaping status: Never Used  Substance Use Topics   Alcohol use: Yes    Alcohol/week: 2.0 standard drinks of alcohol    Types: 2 Cans of beer per week    Comment: occ   Drug use: Yes    Types: Marijuana    ROS: Constitutional:  Negative for fever, chills, weight loss CV: Negative for chest pain, previous MI, hypertension Respiratory:  Negative for shortness of breath, wheezing, sleep apnea, frequent cough GI:  Negative for nausea, vomiting, bloody stool, GERD  Physical exam: There were no vitals taken for this visit. GENERAL APPEARANCE:  Well appearing, well developed, well nourished, NAD HEENT:  Atraumatic, normocephalic, oropharynx  clear NECK:  Supple without lymphadenopathy or thyromegaly ABDOMEN:  Soft, non-tender, no masses EXTREMITIES:  Moves all extremities well, without clubbing, cyanosis, or edema NEUROLOGIC:  Alert and oriented x 3, normal gait, CN II-XII grossly intact MENTAL STATUS:  appropriate BACK:  Non-tender to palpation, No CVAT SKIN:  Warm, dry, and intact    Results: U/A:

## 2024-07-18 LAB — GENECONNECT MOLECULAR SCREEN: Genetic Analysis Overall Interpretation: NEGATIVE

## 2024-07-19 ENCOUNTER — Telehealth: Payer: Self-pay | Admitting: Internal Medicine

## 2024-07-19 DIAGNOSIS — K2970 Gastritis, unspecified, without bleeding: Secondary | ICD-10-CM

## 2024-07-19 DIAGNOSIS — R1084 Generalized abdominal pain: Secondary | ICD-10-CM

## 2024-07-19 DIAGNOSIS — R933 Abnormal findings on diagnostic imaging of other parts of digestive tract: Secondary | ICD-10-CM

## 2024-07-19 MED ORDER — PANTOPRAZOLE SODIUM 40 MG PO TBEC: 40.0000 mg | DELAYED_RELEASE_TABLET | Freq: Every day | ORAL | 2 refills | Status: DC

## 2024-07-19 NOTE — Telephone Encounter (Signed)
 He went to the ED yesterday at [facility] in [place] for abdominal pain and RUQ swelling and just left today. He was given several medications including amitriptyline and famotidine, which he has not taken yet. Gallbladder ultrasound was normal at that time. They attributed his abdominal pain to IBS or food intolerance. He wanted to know if it would be okay to take these medications. I told him that it would be fine. He ran out of his pantoprazole  and was also asking for a new prescription. A new prescription for pantoprazole  40 mg every day was sent to the pharmacy of his choice. When he was taking his pantoprazole , this seemed to help with his GI symptoms. He has not pooped today. I recommended that he try a few doses of Miralax to see if constipation is contributing to his bloating and abdominal pain as well. Will CC Dr. Charlanne and Deanna to this telephone note.

## 2024-07-20 ENCOUNTER — Encounter (HOSPITAL_COMMUNITY): Payer: Self-pay

## 2024-07-20 ENCOUNTER — Other Ambulatory Visit: Payer: Self-pay

## 2024-07-20 ENCOUNTER — Emergency Department (HOSPITAL_COMMUNITY)
Admission: EM | Admit: 2024-07-20 | Discharge: 2024-07-20 | Disposition: A | Discharge status: Home / Self Care | Attending: Emergency Medicine | Admitting: Emergency Medicine

## 2024-07-20 DIAGNOSIS — R1033 Periumbilical pain: Secondary | ICD-10-CM | POA: Diagnosis not present

## 2024-07-20 DIAGNOSIS — R1084 Generalized abdominal pain: Secondary | ICD-10-CM | POA: Diagnosis not present

## 2024-07-20 DIAGNOSIS — R109 Unspecified abdominal pain: Secondary | ICD-10-CM | POA: Diagnosis present

## 2024-07-20 LAB — COMPREHENSIVE METABOLIC PANEL WITH GFR
ALT: 13 U/L (ref 0–44)
AST: 13 U/L — ABNORMAL LOW (ref 15–41)
Albumin: 4.1 g/dL (ref 3.5–5.0)
Alkaline Phosphatase: 68 U/L (ref 38–126)
Anion gap: 9 (ref 5–15)
BUN: 10 mg/dL (ref 6–20)
CO2: 26 mmol/L (ref 22–32)
Calcium: 9.2 mg/dL (ref 8.9–10.3)
Chloride: 103 mmol/L (ref 98–111)
Creatinine, Ser: 1.15 mg/dL (ref 0.61–1.24)
GFR, Estimated: >60 mL/min (ref >60)
Glucose, Bld: 80 mg/dL (ref 70–99)
Potassium: 4.1 mmol/L (ref 3.5–5.1)
Sodium: 138 mmol/L (ref 135–145)
Total Bilirubin: 0.9 mg/dL (ref 0.0–1.2)
Total Protein: 6.4 g/dL — ABNORMAL LOW (ref 6.5–8.1)

## 2024-07-20 LAB — CBC
HCT: 43.1 % (ref 39.0–52.0)
Hemoglobin: 14.5 g/dL (ref 13.0–17.0)
MCH: 29.7 pg (ref 26.0–34.0)
MCHC: 33.6 g/dL (ref 30.0–36.0)
MCV: 88.3 fL (ref 80.0–100.0)
Platelets: 144 K/uL — ABNORMAL LOW (ref 150–400)
RBC: 4.88 MIL/uL (ref 4.22–5.81)
RDW: 12.3 % (ref 11.5–15.5)
WBC: 5.3 K/uL (ref 4.0–10.5)
nRBC: 0 % (ref 0.0–0.2)

## 2024-07-20 LAB — URINALYSIS, ROUTINE W REFLEX MICROSCOPIC
Bilirubin Urine: NEGATIVE
Glucose, UA: NEGATIVE mg/dL
Hgb urine dipstick: NEGATIVE
Ketones, ur: NEGATIVE mg/dL
Leukocytes,Ua: NEGATIVE
Nitrite: NEGATIVE
Protein, ur: NEGATIVE mg/dL
Specific Gravity, Urine: 1.008 (ref 1.005–1.030)
pH: 6 (ref 5.0–8.0)

## 2024-07-20 LAB — LIPASE, BLOOD: Lipase: 28 U/L (ref 11–51)

## 2024-07-20 MED ORDER — SUCRALFATE 1 G PO TABS: 1.0000 g | ORAL_TABLET | Freq: Three times a day (TID) | ORAL | 0 refills | Status: DC | PRN

## 2024-07-20 MED ORDER — DICYCLOMINE HCL 10 MG/ML IM SOLN
20.0000 mg | Freq: Once | INTRAMUSCULAR | Status: AC
  Administered 2024-07-20: 20 mg via INTRAMUSCULAR
  Filled 2024-07-20: qty 2

## 2024-07-20 MED ORDER — DICYCLOMINE HCL 20 MG PO TABS
20.0000 mg | ORAL_TABLET | Freq: Two times a day (BID) | ORAL | 0 refills | Status: DC | PRN
Start: 2024-07-20 — End: 2024-08-01

## 2024-07-20 MED ORDER — SUCRALFATE 1 G PO TABS
1.0000 g | ORAL_TABLET | Freq: Once | ORAL | Status: AC
  Administered 2024-07-20: 1 g via ORAL
  Filled 2024-07-20: qty 1

## 2024-07-20 MED ORDER — SIMETHICONE 250 MG PO CAPS: 250.0000 mg | ORAL_CAPSULE | Freq: Two times a day (BID) | ORAL | 0 refills | Status: DC

## 2024-07-20 NOTE — Discharge Instructions (Addendum)
 Please avoid using any type of marijuana products which can trigger your nausea, abdominal pain and vomiting.

## 2024-07-20 NOTE — ED Triage Notes (Signed)
 Pt c.o sharp abd pains for the past month. Pt seen at [facility] ER yesterday and states he was given an acid reflux med that's not helping. Pt also c.o black diarrhea that started today

## 2024-07-20 NOTE — ED Provider Notes (Signed)
 [place] EMERGENCY DEPARTMENT AT [facility] Provider Note   CSN: [id number] Arrival date & time: 07/20/24  1850     Patient presents with: Abdominal Pain   Darrell Erickson is a 33 y.o. male here with abdominal pain.  Patient reports he has been worked up extensively in OSH for the past few months for abdominal pain.  This month per my review of external records he has been seen at [facility] several times, with RUQ ultrasound, CT abdomen pelvis without emergent findings.He did have a varocele on his left testicle on scrotal ultrasound.  He reports he continues to have morning bloating of his abdomen, sharp diffuse abdominal pains, intermittent diarrhea, poor appetite.  He has established care with [facility] and has a colonoscopy or endoscopy scheduled in October.  He takes protonix  and pepcid daily which has helped some of his symptoms, but not all.  He is here today for a second opinion about his pain.    He reports he was worked up negative for Lupus in the past (family history in his father).  Pt reports hx of umbilical surgery as a child, born prematurely   HPI     Prior to Admission medications   Medication Sig Start Date End Date Taking? Authorizing Provider  alfuzosin  (UROXATRAL ) 10 MG 24 hr tablet Take 1 tablet (10 mg total) by mouth daily. 05/16/24  Yes Stoneking, Adine PARAS., MD  amitriptyline (ELAVIL) 10 MG tablet Take 10 mg by mouth at bedtime. 07/19/24  Yes [provider]  dicyclomine  (BENTYL ) 20 MG tablet Take 1 tablet (20 mg total) by mouth 2 (two) times daily as needed for up to 14 doses for spasms. 07/20/24  Yes Cottie Donnice PARAS, MD  EPINEPHrine  0.3 mg/0.3 mL IJ SOAJ injection Inject 0.3 mg into the muscle as needed for anaphylaxis. 12/28/23  Yes Saguier, Dallas, PA-C  famotidine (PEPCID) 20 MG tablet Take 20 mg by mouth daily.   Yes [provider]  pantoprazole  (PROTONIX ) 40 MG tablet Take 1 tablet (40 mg total) by mouth daily. 07/19/24  Yes  Federico Rosario BROCKS, MD  Simethicone  250 MG CAPS Take 250 mg by mouth 2 (two) times daily. 07/20/24 08/19/24 Yes Gearl Baratta, Donnice PARAS, MD  sucralfate  (CARAFATE ) 1 g tablet Take 1 tablet (1 g total) by mouth 3 (three) times daily as needed for up to 60 doses. 07/20/24  Yes Cottie Donnice PARAS, MD  VENTOLIN  HFA 108 [phone] Base) MCG/ACT inhaler Inhale 2 puffs into the lungs every 6 (six) hours as needed for wheezing or shortness of breath. 11/02/23  Yes Saguier, Dallas, PA-C    Allergies: Iodine, Shellfish-derived products, Walnut, Amoxicillin, and Egg-derived products    Review of Systems  Updated Vital Signs BP (!) 151/97 (BP Location: Right Arm)   Pulse 73   Temp 98.2 F (36.8 C)   Resp 15   Ht 6' (1.829 m)   Wt 68 kg   SpO2 100%   BMI 20.34 kg/m   Physical Exam Constitutional:      General: He is not in acute distress.    Comments: Tall, thin habitus  HENT:     Head: Normocephalic and atraumatic.  Eyes:     Conjunctiva/sclera: Conjunctivae normal.     Pupils: Pupils are equal, round, and reactive to light.     Comments: Left eye enucleated (chronic)  Cardiovascular:     Rate and Rhythm: Normal rate and regular rhythm.  Pulmonary:     Effort: Pulmonary effort is  normal. No respiratory distress.  Abdominal:     General: There is no distension.     Tenderness: There is abdominal tenderness in the periumbilical area. There is no guarding or rebound. Negative signs include Murphy's sign and McBurney's sign.  Skin:    General: Skin is warm and dry.  Neurological:     General: No focal deficit present.     Mental Status: He is alert. Mental status is at baseline.  Psychiatric:        Mood and Affect: Mood normal.        Behavior: Behavior normal.     (all labs ordered are listed, but only abnormal results are displayed) Labs Reviewed  COMPREHENSIVE METABOLIC PANEL WITH GFR - Abnormal; Notable for the following components:      Result Value   Total Protein 6.4 (*)    AST 13 (*)    All  other components within normal limits  CBC - Abnormal; Notable for the following components:   Platelets 144 (*)    All other components within normal limits  LIPASE, BLOOD  URINALYSIS, ROUTINE W REFLEX MICROSCOPIC    EKG: None  Radiology: No results found.   Procedures   Medications Ordered in the ED  dicyclomine  (BENTYL ) injection 20 mg (20 mg Intramuscular Given 07/20/24 2009)  sucralfate  (CARAFATE ) tablet 1 g (1 g Oral Given 07/20/24 2009)                                    Medical Decision Making Amount and/or Complexity of Data Reviewed Labs: ordered.  Risk OTC drugs. Prescription drug management.   This patient presents to the ED with concern for sharp intermittent abdominal pains and bloating. This involves an extensive number of treatment options, and is a complaint that carries with it a high risk of complications and morbidity.  The differential diagnosis includes IBS vs biliary colic vs pancreatitis vs gastritis vs other  External records from outside source obtained and reviewed including [facility] records from earlier this month including RUQ ultrasound and CT imaging with no emergent cause for abdominal pain (noting gastric wall thickening only); noting also a history of chronic abdominal pain with ED visits in April 2025 for similar issues.  I ordered and personally interpreted labs.  The pertinent results include:  no emergent findings  I ordered medication including IM bentyl  and carafate  for suspected bowel spasm and gastritis  I have reviewed the patients home medicines and have made adjustments as needed  Test Considered: I do not see an indication for emergent repeat CT imaging. I doubt acute appendicitis, cholecystitis, surgical emergency, testicular emergency, ileus or obstruction.  After the interventions noted above, I reevaluated the patient and found that they have: improved  Patient reassured that I do not believe there is a life  threatening medical condition ongoing, including GI bleed.  We can prescribe bentyl  and carafate  and consider simethecine at home for his gas pain and bloating. But he might benefit  Social: Patient counseled on importance of marijuana cessation (he reports he's cut back on smoking but still using- I advised stopping altogether).  Disposition:  After consideration of the diagnostic results and the patient's response to treatment, I feel that the patent would benefit from outpatient follow up     Final diagnoses:  Generalized abdominal pain    ED Discharge Orders  Ordered    sucralfate  (CARAFATE ) 1 g tablet  3 times daily PRN        07/20/24 2050    dicyclomine  (BENTYL ) 20 MG tablet  2 times daily PRN        07/20/24 2050    Simethicone  250 MG CAPS  2 times daily       Note to Pharmacy: If not available on formulary you can direct patient to over the counter [employer] or similar brand of simethicone .   07/20/24 2051               Cottie Donnice PARAS, MD 07/20/24 2052

## 2024-07-21 ENCOUNTER — Telehealth: Payer: Self-pay | Admitting: Gastroenterology

## 2024-07-21 ENCOUNTER — Other Ambulatory Visit: Payer: Self-pay

## 2024-07-21 ENCOUNTER — Telehealth: Payer: Self-pay | Admitting: Internal Medicine

## 2024-07-21 NOTE — Telephone Encounter (Signed)
 Attempted to call patient and follow-up on how he is doing, no answer LVM.

## 2024-07-21 NOTE — Telephone Encounter (Signed)
 Patient called answering service tonight while I was on-call  Seen recently by [facility] Assigned to Dr. Charlanne with scheduled for EGD colonoscopy on 09/10/2024  He was seen in the emergency department with ongoing abdominal pain He wanted reassurance that the dicyclomine , simethicone  and sucralfate  were okay to take with his pantoprazole  40 mg  Reassurance provided  I let him know that I would let Dr. Charlanne and his pod B team know so that they can make any additional recommendations and determine if sooner endoscopic evaluation is indicated  Time provided for questions and answers and he thanked me for the call

## 2024-07-21 NOTE — Telephone Encounter (Signed)
Patient returning call. Thank you

## 2024-07-22 ENCOUNTER — Ambulatory Visit: Admitting: Urology

## 2024-07-22 NOTE — Progress Notes (Deleted)
 Assessment: 1. Lower urinary tract symptoms (LUTS)     Plan: Trial of alfuzosin  for lower urinary tract symptoms. Return to office in 6 weeks.  Chief Complaint:  No chief complaint on file.   History of Present Illness:  Darrell Erickson is a 33 y.o. male who is seen for further evaluation of  lower urinary tract symptoms.    At his initial visit in April 2025, he reported intermittent pain in the left scrotum for approximately 1 year.  He also reported occasional scrotal swelling.  He had a recent increase in the severity of the pain necessitating evaluation. Scrotal ultrasound from 03/05/2024 showed normal testes bilaterally and a left varicocele. PSA 0.71 Urine culture:  <10K colonies  He also had noted an increase in his lower urinary tract symptoms with straining to void and some dysuria.  He had right sided abdominal pain with radiation to the right flank area, typically after eating.  His urinary symptoms improved within the week prior to his visit.  IPSS = 25/5. PVR = 1 mL  No history of kidney stones or UTIs.  His exam was consistent with left epididymitis.  He was treated with doxycycline  x 10 days.  At his visit on 05/16/2024, he reported that his scrotal and penile pain had improved.  He continued to have lower urinary tract symptoms with frequency, incomplete emptying, weak stream, and straining. IPSS = 22/6. PVR = 10 mL He was also reporting numbness of the skin in his periumbilical area. He was started on alfuzosin  10 mg daily for his lower urinary tract symptoms.  Portions of the above documentation were copied from a prior visit for review purposes only.  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 No issues eating peanuts.   Amoxicillin Hives and Rash   Egg-Derived Products Nausea And Vomiting    Family History:  Family History  Problem Relation Age of Onset   Cataracts Mother    Cirrhosis Mother        EtOH   Bipolar disorder Mother    Schizophrenia Mother    Glaucoma Father    Heart disease Father        Valve replacement Calcified   Kidney failure Father    Lupus Father    Hypertension Paternal Uncle    Colon cancer Paternal Uncle    Hypertension Paternal Uncle    Colon cancer Paternal Uncle    Diabetes Paternal Grandmother    Hypertension Paternal Grandmother    Premature birth Daughter        38 wks PPROM; emergency c-section NICU for 3 months   Lupus Paternal Uncle    Prostate cancer Other 40 - 49   Stomach cancer Neg Hx    Rectal cancer Neg Hx    Liver cancer Neg Hx  Pancreatic cancer Neg Hx     Social History:  Social History   Tobacco Use   Smoking status: Never    Passive exposure: Never   Smokeless tobacco: Never  Vaping Use   Vaping status: Never Used  Substance Use Topics   Alcohol use: Yes    Alcohol/week: 2.0 standard drinks of alcohol    Types: 2 Cans of beer per week    Comment: occ   Drug use: Yes    Types: Marijuana    ROS: Constitutional:  Negative for fever, chills, weight loss CV: Negative for chest pain, previous MI, hypertension Respiratory:  Negative for shortness of breath, wheezing, sleep apnea, frequent cough GI:  Negative for nausea, vomiting, bloody stool, GERD  Physical exam: There were no vitals taken for this visit. GENERAL APPEARANCE:  Well appearing, well developed, well nourished, NAD HEENT:  Atraumatic, normocephalic, oropharynx  clear NECK:  Supple without lymphadenopathy or thyromegaly ABDOMEN:  Soft, non-tender, no masses EXTREMITIES:  Moves all extremities well, without clubbing, cyanosis, or edema NEUROLOGIC:  Alert and oriented x 3, normal gait, CN II-XII grossly intact MENTAL STATUS:  appropriate BACK:  Non-tender to palpation, No CVAT SKIN:  Warm, dry, and intact    Results: U/A:

## 2024-07-23 ENCOUNTER — Encounter: Payer: Self-pay | Admitting: Medical

## 2024-07-23 DIAGNOSIS — R399 Unspecified symptoms and signs involving the genitourinary system: Secondary | ICD-10-CM | POA: Insufficient documentation

## 2024-07-23 NOTE — Telephone Encounter (Signed)
 Pt called left voicemail for him to call us  and schedule an apt  Will try again later on today

## 2024-07-23 NOTE — Assessment & Plan Note (Addendum)
 Chronic, non-specific LUTS Hx of chronic scrotal pain-chronic inflammatory epididymitis Started on Uroxatral  in June 2025  I think pelvic floor dysfunction may be playing a large role in the spectrum of pelvic organ complaints he is experiencing.  I offered a lot of reassurance today, as I do not think any symptom is a sign of larger or more worrisome pathology.  - Continue Uroxatrol for dysfunctional voiding, possible chronic prostatitis, chronic LUTS - Recommend referral to pelvic PT-I think this may be his most beneficial treatment avenue at this point

## 2024-07-23 NOTE — Progress Notes (Signed)
   08/01/2024 9:34 AM   Darrell Erickson 05/31/1991 [id number]  Reason for visit: Follow up LUTS, dysuria   HPI: Initial follow up with me today, previously followed by Dr. Roseann Gab on Uroxatral  for LUTS in June 2025  Patient has several concerns across multiple mental health domains today History of chronic LUTS, overactive bladder, dysfunctional voiding, subjective incomplete emptying, chronic epididymitis, chronic scrotal pain, constipation  Prior HPI: Previously followed by Dr. Roseann, last seen in June 2025 - Left epididymitis, LUTS, left scrotal pain   - prior 10d course of Doxy for left epididymitis IPSS = 25/5. PVR = 1 mL  Scrotal ultrasound from 03/05/2024 showed normal testes bilaterally and a left varicocele   Physical Exam: BP 131/87 (BP Location: Left Arm, Patient Position: Sitting, Cuff Size: Normal)   Pulse (!) 56   Ht 6' (1.829 m)   Wt 142 lb 3.2 oz (64.5 kg)   BMI 19.29 kg/m    Constitutional:  Alert and oriented, No acute distress.  GU: Circumcised phallus, morphologically normal penoscrotal exam and bilateral testes.  Normal bilateral epididymis, although focally tender to palpation at left epididymal head.  No testicular masses.  Urethral meatus normal no discharge  Laboratory Data:  Latest Reference Range & Units 03/05/24 09:07  PSA 0.10 - 4.00 ng/mL 0.71    Latest Reference Range & Units 07/20/24 19:46  Creatinine 0.61 - 1.24 mg/dL 8.84   Pertinent Imaging: N/A   Assessment & Plan:    Lower urinary tract symptoms (LUTS) Assessment & Plan: Chronic, non-specific LUTS Hx of chronic scrotal pain-chronic inflammatory epididymitis Started on Uroxatral  in June 2025  I think pelvic floor dysfunction may be playing a large role in the spectrum of pelvic organ complaints he is experiencing.  I offered a lot of reassurance today, as I do not think any symptom is a sign of larger or more worrisome pathology.  - Continue Uroxatrol for  dysfunctional voiding, possible chronic prostatitis, chronic LUTS - Recommend referral to pelvic PT-I think this may be his most beneficial treatment avenue at this point  Orders: -     Urinalysis, Complete -     Ambulatory referral to Physical Therapy  Epididymitis, left Assessment & Plan: Chronic bilateral epididymitis and scrotal pain Left > right UA today negative for infection  We reviewed the common causes of scrotal pain and the basic tenets of management. For many patients, symptoms are self-limited and respond to conservative management. Initial strategies include NSAIDs, scrotal support, ice packs, and/or warm sitz baths. Identifying exacerbating triggers, modification of activities and/or premedication can help reduce episodes and severity. For chronic syndromes, we may consider pelvic floor physical therapy or referral to a chronic pain specialist. In select cases, we may consider surgical treatment with anesthetic cord blocks +/- spermatic cord denervation.    - Unfortunately NSAIDs contraindicated due to upper GI issues/gastritis -Recommend close attention to inciting triggers and premedication before sexual activity -Incorporate Tylenol, ice packs, supportive underwear and possible sitz baths        [name], MD  [facility] [address], [address] [place], [place] [address] [phone]

## 2024-07-24 ENCOUNTER — Ambulatory Visit (INDEPENDENT_AMBULATORY_CARE_PROVIDER_SITE_OTHER): Admitting: Medical

## 2024-07-24 VITALS — BP 136/70 | HR 58 | Temp 97.7°F | Resp 16 | Ht 72.0 in | Wt 141.8 lb

## 2024-07-24 DIAGNOSIS — R1032 Left lower quadrant pain: Secondary | ICD-10-CM

## 2024-07-24 DIAGNOSIS — R1084 Generalized abdominal pain: Secondary | ICD-10-CM

## 2024-07-24 DIAGNOSIS — R197 Diarrhea, unspecified: Secondary | ICD-10-CM | POA: Diagnosis not present

## 2024-07-24 DIAGNOSIS — M549 Dorsalgia, unspecified: Secondary | ICD-10-CM

## 2024-07-24 LAB — COMPREHENSIVE METABOLIC PANEL WITH GFR
ALT: 10 U/L (ref 0–53)
AST: 11 U/L (ref 0–37)
Albumin: 4.7 g/dL (ref 3.5–5.2)
Alkaline Phosphatase: 70 U/L (ref 39–117)
BUN: 14 mg/dL (ref 6–23)
CO2: 30 meq/L (ref 19–32)
Calcium: 9.2 mg/dL (ref 8.4–10.5)
Chloride: 104 meq/L (ref 96–112)
Creatinine, Ser: 1.03 mg/dL (ref 0.40–1.50)
GFR: 96.04 mL/min (ref >60.00)
Glucose, Bld: 90 mg/dL (ref 70–99)
Potassium: 4.1 meq/L (ref 3.5–5.1)
Sodium: 143 meq/L (ref 135–145)
Total Bilirubin: 0.7 mg/dL (ref 0.2–1.2)
Total Protein: 6.7 g/dL (ref 6.0–8.3)

## 2024-07-24 LAB — POCT URINALYSIS DIPSTICK
Blood, UA: NEGATIVE
Glucose, UA: NEGATIVE
Leukocytes, UA: NEGATIVE
Nitrite, UA: NEGATIVE
Protein, UA: NEGATIVE
Spec Grav, UA: >=1.03 — AB (ref 1.010–1.025)
Urobilinogen, UA: 0.2 U/dL
pH, UA: 6 (ref 5.0–8.0)

## 2024-07-24 LAB — CBC WITH DIFFERENTIAL/PLATELET
Basophils Absolute: 0 K/uL (ref 0.0–0.1)
Basophils Relative: 0.4 % (ref 0.0–3.0)
Eosinophils Absolute: 0.2 K/uL (ref 0.0–0.7)
Eosinophils Relative: 2.7 % (ref 0.0–5.0)
HCT: 44.3 % (ref 39.0–52.0)
Hemoglobin: 14.7 g/dL (ref 13.0–17.0)
Lymphocytes Relative: 29 % (ref 12.0–46.0)
Lymphs Abs: 1.7 K/uL (ref 0.7–4.0)
MCHC: 33.3 g/dL (ref 30.0–36.0)
MCV: 88.6 fl (ref 78.0–100.0)
Monocytes Absolute: 0.6 K/uL (ref 0.1–1.0)
Monocytes Relative: 9.4 % (ref 3.0–12.0)
Neutro Abs: 3.5 K/uL (ref 1.4–7.7)
Neutrophils Relative %: 58.5 % (ref 43.0–77.0)
Platelets: 125 K/uL — ABNORMAL LOW (ref 150.0–400.0)
RBC: 5 Mil/uL (ref 4.22–5.81)
RDW: 13.1 % (ref 11.5–15.5)
WBC: 6 K/uL (ref 4.0–10.5)

## 2024-07-24 NOTE — Patient Instructions (Addendum)
 Chronic abdominal pain with bloating and left lower quadrant pain for one month.(at times some chills and sweats) Chronic abdominal pain with possible diverticulitis. GI evaluation ongoing with EGD and colonoscopy scheduled in october. - Order CT abdomen and pelvis with contrast Evaluate llq for diverticulitis. - Check metabolic panel and CBC. - Continue current medications. - Obtain prior authorization for CT scan.  Chronic watery diarrhea Chronic watery diarrhea coinciding with abdominal issues, not linked to antibiotics. -Stool studies order culture, o and p, c dif.  Chronic dysuria and urinary symptoms Chronic dysuria with ineffective alfuzosin  treatment.  - Perform urine culture. - Follow up with urologist next Tuesday.  Dehydration (mild, suspected) Mild dehydration suspected with normal creatinine levels. - Encourage continued hydration.  Follow up 7-10 days or sooner if needed

## 2024-07-24 NOTE — Progress Notes (Signed)
 cbc  Subjective:    Patient ID: Darrell Erickson, male    DOB: [birth year], 33 y.o.   MRN: [id number]  HPI  Darrell Erickson is a 33 year old male who presents with abdominal pain and urinary symptoms.  He has been experiencing stabbing abdominal pain with swelling and a sensation of fullness for several months. The pain is primarily located in the abdomen with some left side cva pain. Previous consultations with a gastroenterologist led to dietary changes and prescriptions for Protonix , dicyclomine , simethicone , and sucralfate , but these have not alleviated his symptoms. He denies any burning sensation associated with reflux. He clarifies has not started sucraflate or dicycolmine.  He reports urinary symptoms including foamy urine, a burning sensation, and reduced urine output. Despite a history of urinary symptoms and treatment with alfuzosin  by a urologist, his symptoms persist. He experiences burning during urination, difficulty with urine stream, and foamy urine despite increased water intake. He has a history of epididymitis treated with antibiotics, but urinary symptoms continued. Not when used doxycycline  for epididymitis it did not improve his dysuria. He is scheduled to see urologist next week.  He has been experiencing watery diarrhea once daily for the past three months. He denies recent antibiotic use. He also reports chills and night sweats, resulting in wet sheets, but no fever.  He is concerned about the impact of medications on his kidney function, noting a history of elevated creatinine levels and a GFR of 65, although recent tests showed creatinine at 1.15 and GFR greater than 60.       Review of Systems  Constitutional:  Positive for chills and diaphoresis. Negative for fatigue.  Respiratory:  Negative for cough, chest tightness and wheezing.   Cardiovascular:  Negative for chest pain and palpitations.  Gastrointestinal:  Positive for abdominal pain. Negative for abdominal  distention, blood in stool, diarrhea and vomiting.  Genitourinary:  Negative for dysuria, flank pain and frequency.       See hpi  Musculoskeletal:  Negative for back pain and myalgias.  Skin:  Negative for pallor.  Neurological:  Negative for tremors, facial asymmetry and numbness.  Hematological:  Negative for adenopathy.  Psychiatric/Behavioral:  Negative for behavioral problems, decreased concentration and hallucinations.     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  Tobacco Use   Smoking status: Never    Passive exposure: Never   Smokeless tobacco: Never  Vaping Use   Vaping status: Never Used  Substance and Sexual Activity   Alcohol use: Yes    Alcohol/week: 2.0 standard drinks of alcohol    Types: 2 Cans of beer per week    Comment: occ   Drug use: Yes    Types: Marijuana   Sexual activity: Yes  Other Topics Concern   Not on file  Social History Narrative   Not on file  Social Drivers of Corporate investment banker Strain: Low Risk  (07/24/2024)   Overall Financial Resource Strain (CARDIA)    Difficulty of Paying Living Expenses: Not very hard  Food Insecurity: No Food Insecurity (07/24/2024)   Hunger Vital Sign    Worried About Running Out of Food in the Last Year: Never true    Ran Out of Food in the Last Year: Never true  Transportation Needs: No Transportation Needs (07/24/2024)   PRAPARE - Administrator, Civil Service (Medical): No    Lack of Transportation  (Non-Medical): No  Physical Activity: Insufficiently Active (07/24/2024)   Exercise Vital Sign    Days of Exercise per Week: 1 day    Minutes of Exercise per Session: 30 min  Stress: No Stress Concern Present (07/24/2024)   [employer] of Occupational Health - Occupational Stress Questionnaire    Feeling of Stress: Only a little  Recent Concern: Stress - Stress Concern Present (06/11/2024)   Received from [facility] of Occupational Health - Occupational Stress Questionnaire    Do you feel stress - tense, restless, nervous, or anxious, or unable to sleep at night because your mind is troubled all the time - these days?: To some extent  Social Connections: Socially Isolated (07/24/2024)   Social Connection and Isolation Panel    Frequency of Communication with Friends and Family: Once a week    Frequency of Social Gatherings with Friends and Family: Once a week    Attends Religious Services: Never    Database administrator or Organizations: No    Attends Engineer, structural: Not on file    Marital Status: Married  Catering manager Violence: Not At Risk (07/19/2024)   Received from [facility]   HITS    Over the last 12 months how often did your partner physically hurt you?: Never    Over the last 12 months how often did your partner insult you or talk down to you?: Never    Over the last 12 months how often did your partner threaten you with physical harm?: Never    Over the last 12 months how often did your partner scream or curse at you?: Never    Past Surgical History:  Procedure Laterality Date   CYSTECTOMY  2006   scalp    Family History  Problem Relation Age of Onset   Cataracts Mother    Cirrhosis Mother        EtOH   Bipolar disorder Mother    Schizophrenia Mother    Glaucoma Father    Heart disease Father        Valve replacement Calcified   Kidney failure Father    Lupus Father    Hypertension Paternal Uncle    Colon cancer  Paternal Uncle    Hypertension Paternal Uncle    Colon cancer Paternal Uncle    Diabetes Paternal Grandmother    Hypertension Paternal Grandmother    Premature birth Daughter        53 wks PPROM; emergency c-section NICU for 3 months   Lupus Paternal Uncle    Prostate cancer Other 40 - 49   Stomach cancer Neg Hx    Rectal cancer Neg Hx    Liver cancer Neg Hx    Pancreatic cancer Neg Hx     Allergies  Allergen Reactions   Iodine Hives and Itching   Shellfish-Derived Products Anaphylaxis and Itching   Walnut Anaphylaxis and Shortness  Of Breath    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 No issues eating peanuts.   Amoxicillin Hives and Rash   Egg-Derived Products Nausea And Vomiting    Current Outpatient Medications on File Prior to Visit  Medication Sig Dispense Refill   pantoprazole  (PROTONIX ) 40 MG tablet Take 1 tablet (40 mg total) by mouth daily. 30 tablet 2   alfuzosin  (UROXATRAL ) 10 MG 24 hr tablet Take 1 tablet (10 mg total) by mouth daily. (Patient not taking: Reported on 07/24/2024) 30 tablet 11   amitriptyline (ELAVIL) 10 MG tablet Take 10 mg by mouth at bedtime. (Patient not taking: Reported on 07/24/2024)     dicyclomine  (BENTYL ) 20 MG tablet Take 1 tablet (20 mg total) by mouth 2 (two) times daily as needed for up to 14 doses for spasms. (Patient not taking: Reported on 07/24/2024) 14 tablet 0   EPINEPHrine  0.3 mg/0.3 mL IJ SOAJ injection Inject 0.3 mg into the muscle as needed for anaphylaxis. (Patient not taking: Reported on 07/24/2024) 1 each 1   famotidine (PEPCID) 20 MG tablet Take 20 mg by mouth daily. (Patient not taking: Reported on 07/24/2024)     Simethicone  250 MG CAPS Take 250 mg by mouth 2 (two) times daily. (Patient not taking: Reported on 07/24/2024) 60 capsule 0   sucralfate  (CARAFATE ) 1 g tablet Take 1 tablet (1 g total) by mouth 3 (three) times daily as needed for up to 60 doses. (Patient not taking: Reported on 07/24/2024) 60 tablet 0   VENTOLIN  HFA 108 (90 Base)  MCG/ACT inhaler Inhale 2 puffs into the lungs every 6 (six) hours as needed for wheezing or shortness of breath. (Patient not taking: Reported on 07/24/2024) 18 g 5   No current facility-administered medications on file prior to visit.    BP 136/70   Pulse (!) 58   Temp 97.7 F (36.5 C) (Oral)   Resp 16   Ht 6' (1.829 m)   Wt 141 lb 12.8 oz (64.3 kg)   SpO2 99%   BMI 19.23 kg/m          Objective:   Physical Exam  General Mental Status- Alert. General Appearance- Not in acute distress.   Skin General: Color- Normal Color. Moisture- Normal Moisture.  Neck Carotid Arteries- Normal color. Moisture- Normal Moisture. No carotid bruits. No JVD.  Chest and Lung Exam Auscultation: Breath Sounds:-Normal.  Cardiovascular Auscultation:Rythm- Regular. Murmurs & Other Heart Sounds:Auscultation of the heart reveals- No Murmurs.  Abdomen Inspection:-Inspeection Normal. Palpation/Percussion:Note:No mass. Palpation and Percussion of the abdomen reveal- moderate llq Tender, Non Distended + BS, no rebound or guarding.    Neurologic Cranial Nerve exam:- CN III-XII intact(No nystagmus), symmetric smile. Strength:- 5/5 equal and symmetric strength both upper and lower extremities.       Assessment & Plan:   Patient Instructions  Chronic abdominal pain with bloating and left lower quadrant pain for one month.(at times some chills and sweats) Chronic abdominal pain with possible diverticulitis. GI evaluation ongoing with EGD and colonoscopy scheduled in october. - Order CT abdomen and pelvis with contrast Evaluate llq for diverticulitis. - Check metabolic panel and CBC. - Continue current medications. - Obtain prior authorization for CT scan.  Chronic watery diarrhea Chronic watery diarrhea coinciding with abdominal issues, not linked to antibiotics. -Stool studies order culture, o and p, c dif.  Chronic dysuria and urinary symptoms Chronic dysuria with ineffective alfuzosin   treatment.  - Perform urine culture. - Follow up with urologist next Tuesday.  Dehydration (mild, suspected)  Mild dehydration suspected with normal creatinine levels. - Encourage continued hydration.  Follow up 7-10 days or sooner if needed    Fifi Schindler, PA-C   Time spent with patient today was  43 minutes which consisted of chart review, discussing diagnosis, work up, treatment, coordinating ct prior auth and documentation.

## 2024-07-25 ENCOUNTER — Ambulatory Visit (HOSPITAL_BASED_OUTPATIENT_CLINIC_OR_DEPARTMENT_OTHER)
Admission: RE | Admit: 2024-07-25 | Discharge: 2024-07-25 | Disposition: A | Discharge status: Home / Self Care | Source: Ambulatory Visit | Attending: Medical | Admitting: Medical

## 2024-07-25 ENCOUNTER — Ambulatory Visit: Payer: Self-pay

## 2024-07-25 ENCOUNTER — Other Ambulatory Visit

## 2024-07-25 DIAGNOSIS — R1084 Generalized abdominal pain: Secondary | ICD-10-CM | POA: Insufficient documentation

## 2024-07-25 DIAGNOSIS — R197 Diarrhea, unspecified: Secondary | ICD-10-CM

## 2024-07-25 DIAGNOSIS — R1032 Left lower quadrant pain: Secondary | ICD-10-CM | POA: Insufficient documentation

## 2024-07-25 LAB — URINE CULTURE
MICRO NUMBER:: 16896453
Result:: NO GROWTH
SPECIMEN QUALITY:: ADEQUATE

## 2024-07-25 MED ORDER — IOHEXOL 300 MG/ML  SOLN
100.0000 mL | Freq: Once | INTRAMUSCULAR | Status: AC | PRN
  Administered 2024-07-25: 100 mL via INTRAVENOUS

## 2024-07-25 NOTE — Telephone Encounter (Signed)
 Not sure but leila said they will be contacting him soon

## 2024-07-25 NOTE — Telephone Encounter (Signed)
 FYI Only or Action Required?: Action required by provider: requesting update on his CT order (has not heard back for scheduling).  Patient was last seen in primary care on 07/24/2024 by Dorina Loving, PA-C.  Called Nurse Triage reporting recent visit.  Symptoms began about a month ago.  Interventions attempted: Other: following up with GI, taking Bentyl  and Carafate , following up with urology.  Symptoms are: stabbing pain, abdominal bloating/fullness, fatigue, and urinary issues unchanged.  Triage Disposition: Call PCP Within 24 Hours  Patient/caregiver understands and will follow disposition?: Yes          Reason for Disposition  [1] Follow-up call from patient regarding patient's clinical status AND [2] information NON-URGENT  Answer Assessment - Initial Assessment Questions 1. REASON FOR CALL or QUESTION: What is your reason for calling today? or How can I best     Patient seen yesterday with PCP Loving. He states he is still having stabbing pain, abdominal bloating/fullness, fatigue, and urinary issues. He states he is freaking out and concerned about his symptoms. He states he has his procedures set up with GI for October 15th. He states he has a CT ordered and was told it has a rush on it to get done before the 3 day weekend by his provider. Patient would like an update on his CT order. Read off PCP's note that prior authorization needed to be obtained. Patient states he doesn't know why his insurance would need prior authorization and he would like an update. He is also worried about some of the medications he is on, states he is concerned about Bentyl  or Carafate  are contradictory to his urinary retention symptoms.  2. CALLER: Document the source of call. (e.g., laboratory staff, caregiver or patient).     Patient.  Protocols used: PCP Call - No Triage-A-AH

## 2024-07-25 NOTE — Telephone Encounter (Signed)
 Contacted laila she said she is contacting his insurance now and will update me

## 2024-07-25 NOTE — Telephone Encounter (Signed)
 CT order approved

## 2024-07-25 NOTE — Telephone Encounter (Signed)
 Spoke to pt informed him that his referral is still pending and he will most certainly be called  States that he is just worried because he has been googling his symptoms and its not good  Advised him that everything will be ok and he will be contacted soon

## 2024-07-26 ENCOUNTER — Ambulatory Visit: Payer: Self-pay | Admitting: Medical

## 2024-07-26 LAB — CLOSTRIDIUM DIFFICILE BY PCR: Toxigenic C. Difficile by PCR: NEGATIVE

## 2024-07-29 ENCOUNTER — Telehealth: Payer: Self-pay

## 2024-07-29 LAB — STOOL CULTURE: E coli, Shiga toxin Assay: NEGATIVE

## 2024-07-29 NOTE — Telephone Encounter (Signed)
-----   Message from Norleen Kiang sent at 07/29/2024 10:21 AM EDT ----- Genna with me to schedule colonoscopy/EGD in [facility]. The patient will resume care with Dr. Charlanne thereafter. Thanks. Dr. Kiang ----- Message ----- From: Mercer Cristino SAILOR, RN Sent: 07/29/2024   9:54 AM EDT To: Norleen SAILOR Kiang, MD  Dr. Kiang, please see note below and advise if OK to schedule with you in the [facility] on 9/11 for double procedure? ----- Message ----- From: May, Deanna J, NP Sent: 07/24/2024   7:26 AM EDT To: Cristino SAILOR Mercer, RN  Yes, please.   Cathryne, NP ----- Message ----- From: Mercer Cristino SAILOR, RN Sent: 07/23/2024   9:49 AM EDT To: Cathryne PARAS May, NP  Deanna, Dr. Charlanne does not have anything sooner for a double procedure. Soonest opening for a double in the [facility] is with Dr. Kiang on 08/07/24 arriving at 2:30 pm. We would need to get approval from Dr. Kiang before scheduling. Thanks ----- Message ----- From: May, Deanna J, NP Sent: 07/22/2024   8:30 AM EDT To: [facility] Pod B Triage  Pod B- Can we see if there are any cancellations for dr charlanne to move his procedures up sooner.  Deanna, NP

## 2024-07-30 LAB — OVA AND PARASITE EXAMINATION
CONCENTRATE RESULT:: NONE SEEN
MICRO NUMBER:: 16902356
SPECIMEN QUALITY:: ADEQUATE
TRICHROME RESULT:: NONE SEEN

## 2024-08-01 ENCOUNTER — Ambulatory Visit: Admitting: Urology

## 2024-08-01 ENCOUNTER — Encounter: Payer: Self-pay | Admitting: Urology

## 2024-08-01 VITALS — BP 131/87 | HR 56 | Ht 72.0 in | Wt 142.2 lb

## 2024-08-01 DIAGNOSIS — R399 Unspecified symptoms and signs involving the genitourinary system: Secondary | ICD-10-CM

## 2024-08-01 DIAGNOSIS — N451 Epididymitis: Secondary | ICD-10-CM | POA: Diagnosis not present

## 2024-08-01 LAB — MICROSCOPIC EXAMINATION

## 2024-08-01 LAB — URINALYSIS, COMPLETE
Bilirubin, UA: NEGATIVE
Glucose, UA: NEGATIVE
Ketones, UA: NEGATIVE
Leukocytes,UA: NEGATIVE
Nitrite, UA: NEGATIVE
Protein,UA: NEGATIVE
Specific Gravity, UA: 1.03 (ref 1.005–1.030)
Urobilinogen, Ur: 0.2 mg/dL (ref 0.2–1.0)
pH, UA: 6 (ref 5.0–7.5)

## 2024-08-01 NOTE — Assessment & Plan Note (Addendum)
 Chronic bilateral epididymitis and scrotal pain Left > right UA today negative for infection  We reviewed the common causes of scrotal pain and the basic tenets of management. For many patients, symptoms are self-limited and respond to conservative management. Initial strategies include NSAIDs, scrotal support, ice packs, and/or warm sitz baths. Identifying exacerbating triggers, modification of activities and/or premedication can help reduce episodes and severity. For chronic syndromes, we may consider pelvic floor physical therapy or referral to a chronic pain specialist. In select cases, we may consider surgical treatment with anesthetic cord blocks +/- spermatic cord denervation.    - Unfortunately NSAIDs contraindicated due to upper GI issues/gastritis -Recommend close attention to inciting triggers and premedication before sexual activity -Incorporate Tylenol, ice packs, supportive underwear and possible sitz baths

## 2024-08-08 ENCOUNTER — Encounter (HOSPITAL_BASED_OUTPATIENT_CLINIC_OR_DEPARTMENT_OTHER): Payer: Self-pay

## 2024-09-10 ENCOUNTER — Ambulatory Visit (AMBULATORY_SURGERY_CENTER): Admitting: Gastroenterology

## 2024-09-10 ENCOUNTER — Encounter: Payer: Self-pay | Admitting: Gastroenterology

## 2024-09-10 VITALS — BP 119/80 | HR 62 | Temp 98.0°F | Resp 14 | Ht 72.0 in | Wt 142.0 lb

## 2024-09-10 DIAGNOSIS — K200 Eosinophilic esophagitis: Secondary | ICD-10-CM | POA: Diagnosis not present

## 2024-09-10 DIAGNOSIS — R194 Change in bowel habit: Secondary | ICD-10-CM

## 2024-09-10 DIAGNOSIS — D125 Benign neoplasm of sigmoid colon: Secondary | ICD-10-CM

## 2024-09-10 DIAGNOSIS — D122 Benign neoplasm of ascending colon: Secondary | ICD-10-CM

## 2024-09-10 DIAGNOSIS — K222 Esophageal obstruction: Secondary | ICD-10-CM | POA: Diagnosis not present

## 2024-09-10 DIAGNOSIS — K2950 Unspecified chronic gastritis without bleeding: Secondary | ICD-10-CM

## 2024-09-10 DIAGNOSIS — K635 Polyp of colon: Secondary | ICD-10-CM

## 2024-09-10 DIAGNOSIS — D128 Benign neoplasm of rectum: Secondary | ICD-10-CM

## 2024-09-10 DIAGNOSIS — K640 First degree hemorrhoids: Secondary | ICD-10-CM

## 2024-09-10 DIAGNOSIS — R1084 Generalized abdominal pain: Secondary | ICD-10-CM

## 2024-09-10 DIAGNOSIS — K449 Diaphragmatic hernia without obstruction or gangrene: Secondary | ICD-10-CM | POA: Diagnosis not present

## 2024-09-10 DIAGNOSIS — K625 Hemorrhage of anus and rectum: Secondary | ICD-10-CM

## 2024-09-10 DIAGNOSIS — R110 Nausea: Secondary | ICD-10-CM

## 2024-09-10 MED ORDER — SODIUM CHLORIDE 0.9 % IV SOLN: 500.0000 mL | Freq: Once | INTRAVENOUS | Status: DC

## 2024-09-10 MED ORDER — PANTOPRAZOLE SODIUM 40 MG PO TBEC: 40.0000 mg | DELAYED_RELEASE_TABLET | Freq: Every day | ORAL | 4 refills | Status: AC

## 2024-09-10 NOTE — Progress Notes (Signed)
 Report given to PACU, vss

## 2024-09-10 NOTE — Progress Notes (Signed)
 1418 Patient experiencing nausea and retching.  MD updated and Zofran 4 mg IV given, vss

## 2024-09-10 NOTE — Progress Notes (Signed)
1404 Robinul 0.1 mg IV given due large amount of secretions upon assessment.  MD made aware, vss  ?

## 2024-09-10 NOTE — Progress Notes (Signed)
 Called to room to assist during endoscopic procedure.  Patient ID and intended procedure confirmed with present staff. Received instructions for my participation in the procedure from the performing physician.

## 2024-09-10 NOTE — Progress Notes (Signed)
 Chief Complaint:abdominal pain Primary GI Doctor: Dr. Charlanne   HPI:  Patient is a  33  year old male patient with past medical history of asthma, bipolar disorder, and ADD, who presents for a evaluation of abdominal pain .   Patient last seen in the GI office on 05/22/2023 by Vina, [place] for complaints of blood in stool.  Patient was put on treatment regimen for constipation and recommendations for colonoscopy.   Patient went to the ED yesterday for abdominal pain.  Lab work shows: WBC 6.4, hemoglobin 14.1, platelets 145, UA negative, normal CMP, lipase 41.  CT abdomen pelvis shows no renal calculi.  No bowel obstruction.  Wall thickening of the stomach which could be artifactual due to underdistention versus gastritis.  Patient discharged with pantoprazole  20 mg po daily.    Interval History    Patient presents for evaluation of abdominal pain. He reports the pain started a few months ago. He notes he had two rounds of antibiotics around this time period for mouth infection and UTI. He reports he started to have RLQ abdominal pain that radiated to left lower abdomen then radiated up into the epigastric region. He describes the pain sometimes can feel like a stabbing pain.  He reports eating makes the pain worse and makes him feel full. He typically only eats once per day. He also complains of constipation with bloating. He reports recently having looser stools, but does not feel he empties out. Patient has intermittent nausea, no vomiting. Patient has history of rectal bleeding with recommendations to have colonoscopy, he would like to proceed with colonoscopy now.   Patient has a lot of fatigue. Patient started on vitamin b 12 for b 12 deficiency    He is currently seeing urologist for further evaluation of left epididymitis and lower urinary tract symptoms.     He smokes marijuana daily.   Family history: father with lupus, uncle with prostate CA   Surgical history: hernia repair as infant       Wt Readings from Last 3 Encounters:  07/08/24 142 lb 4 oz (64.5 kg)  05/16/24 140 lb (63.5 kg)  04/04/24 143 lb (64.9 kg)          Past Medical History:  Diagnosis Date   Allergy     Asthma     Family history of heart murmur      childhood   H/O hernia repair      As newborn   Premature birth      Was born at ~24 weeks   Retinal detachment      as a newborn   Scalp cyst 2006               Past Surgical History:  Procedure Laterality Date   CYSTECTOMY   2006    scalp                Current Outpatient Medications  Medication Sig Dispense Refill   EPINEPHrine  0.3 mg/0.3 mL IJ SOAJ injection Inject 0.3 mg into the muscle as needed for anaphylaxis. 1 each 1   alfuzosin  (UROXATRAL ) 10 MG 24 hr tablet Take 1 tablet (10 mg total) by mouth daily. (Patient not taking: Reported on 07/08/2024) 30 tablet 11   pantoprazole  (PROTONIX ) 20 MG tablet Take 20 mg by mouth daily. (Patient not taking: Reported on 07/08/2024)       VENTOLIN  HFA 108 (90 Base) MCG/ACT inhaler Inhale 2 puffs into the lungs every 6 (six) hours  as needed for wheezing or shortness of breath. 18 g 5      No current facility-administered medications for this visit.             Allergies as of 07/08/2024 - Review Complete 05/16/2024  Allergen Reaction Noted   Iodine Hives and Itching 06/05/2023   Shellfish-derived products Anaphylaxis and Itching 06/04/2014   Walnut Anaphylaxis and Shortness Of Breath 03/14/2024   Amoxicillin Hives and Rash 10/09/2017   Egg-derived products Nausea And Vomiting 06/04/2014           Family History  Problem Relation Age of Onset   Cataracts Mother     Cirrhosis Mother          EtOH   Bipolar disorder Mother     Schizophrenia Mother     Glaucoma Father     Heart disease Father          Valve replacement Calcified   Kidney failure Father     Lupus Father     Hypertension Paternal Uncle     Colon cancer Paternal Uncle     Hypertension Paternal Uncle     Colon  cancer Paternal Uncle     Diabetes Paternal Grandmother     Hypertension Paternal Grandmother     Premature birth Daughter          6 wks PPROM; emergency c-section NICU for 3 months   Lupus Paternal Uncle     Prostate cancer Other 40 - 49   Stomach cancer Neg Hx     Rectal cancer Neg Hx     Liver cancer Neg Hx     Pancreatic cancer Neg Hx            Review of Systems:    Constitutional: No weight loss, fever, chills, weakness or fatigue HEENT: Eyes: No change in vision               Ears, Nose, Throat:  No change in hearing or congestion Skin: No rash or itching Cardiovascular: No chest pain, chest pressure or palpitations   Respiratory: No SOB or cough Gastrointestinal: See HPI and otherwise negative Genitourinary: No dysuria or change in urinary frequency Neurological: No headache, dizziness or syncope Musculoskeletal: No new muscle or joint pain Hematologic: No bleeding or bruising Psychiatric: No history of depression or anxiety      Physical Exam:  Vital signs: BP (!) 140/90 (BP Location: Left Arm, Patient Position: Sitting, Cuff Size: Normal)   Pulse 89   Ht 6' (1.829 m)   Wt 142 lb 4 oz (64.5 kg)   SpO2 99%   BMI 19.29 kg/m    Constitutional:   Pleasant  male appears to be in NAD, Well developed, Well nourished, alert and cooperative Throat: Oral cavity and pharynx without inflammation, swelling or lesion.  Respiratory: Respirations even and unlabored. Lungs clear to auscultation bilaterally.   No wheezes, crackles, or rhonchi.  Cardiovascular: Normal S1, S2. Regular rate and rhythm. No peripheral edema, cyanosis or pallor.  Gastrointestinal:  Soft, nondistended, generalized tenderness. No rebound or guarding. Normal bowel sounds. No appreciable masses or hepatomegaly. Rectal:  Not performed.  Msk:  Symmetrical without gross deformities. Without edema, no deformity or joint abnormality.  Neurologic:  Alert and  oriented x4;  grossly normal neurologically.   Skin:   Dry and intact without significant lesions or rashes.   RELEVANT LABS AND IMAGING: CBC     Latest Ref Rng & Units 03/14/2024    9:01 AM  03/05/2024    9:07 AM 12/28/2023   11:34 AM  CBC  WBC 4.0 - 10.5 K/uL 10.1  6.8  6.0   Hemoglobin 13.0 - 17.0 g/dL 85.4  85.5  85.6   Hematocrit 39.0 - 52.0 % 42.1  44.0  43.8   Platelets 150 - 400 K/uL 163  143.0  136.0       CMP         Latest Ref Rng & Units 03/14/2024    9:01 AM 12/28/2023   11:34 AM 06/05/2023    2:38 PM  CMP  Glucose 70 - 99 mg/dL 96  88  73   BUN 6 - 20 mg/dL 20  13  10    Creatinine 0.61 - 1.24 mg/dL 8.92  9.03  8.99   Sodium 135 - 145 mmol/L 139  142  142   Potassium 3.5 - 5.1 mmol/L 3.8  4.4  4.3   Chloride 98 - 111 mmol/L 101  106  105   CO2 22 - 32 mmol/L 30  29  32   Calcium 8.9 - 10.3 mg/dL 9.2  9.0  9.8   Total Protein 6.5 - 8.1 g/dL 6.8  6.3  6.8   Total Bilirubin 0.0 - 1.2 mg/dL 0.4  0.5  0.7   Alkaline Phos 38 - 126 U/L 84  74     AST 15 - 41 U/L 11  14  13    ALT 0 - 44 U/L 13  19  20        Recent Labs       Lab Results  Component Value Date    TSH 1.55 12/28/2023    01/2023 CRP and ESR normal, ANA positive 07/07/2024 CTAP IMPRESSION:  No renal calculi no hydronephrosis.  No bowel obstruction.  Wall thickening of the stomach which could be artifactual due to under distention versus gastritis.    Assessment:     Encounter Diagnoses  Name Primary?   Nausea without vomiting Yes   Altered bowel habits     Rectal bleeding     Generalized abdominal pain     Bloating     Abnormal CT scan, stomach       33 year old male patient who presents with generalized abdominal pain with nausea and bloating.  ED visit yesterday revealed labs that were unremarkable however CT scan showed patient may have inflammation/gastritis in the stomach.  Will go ahead and proceed with upper GI endoscopy in [facility] for further evaluation.  Instructed patient to start the pantoprazole  daily with strict GERD diet.  Also  recommended patient discontinue marijuana use.  Also complains of issues with altered bowel habits with intermittent episodes of rectal bleeding, recommended patient have colonoscopy last year however unable to schedule.  Patient would like to schedule today with 2-day prep in [facility].  Patient can use fiber supplementation and over-the-counter MiraLAX as needed.   Plan: -Start pantoprazole  40 mg po daily -Strict GERD diet, no late meals -Recommend Marajuana cessation -Recommend high fiber diet -Start Benefiber OTC -Can use Miralax po daily prn for constipation -Schedule EGD in [facility] with Dr. Charlanne. The risks and benefits of EGD with possible biopsies and esophageal dilation were discussed with the patient who agrees to proceed. -Schedule for a colonoscopy in [facility] with Dr. Charlanne. The risks and benefits of colonoscopy with possible polypectomy / biopsies were discussed and the patient agrees to proceed.    Thank you for the courtesy of this consult. Please call me with any questions  or concerns.    Deanna May, FNP-C   Attending physician's note   I have taken history, reviewed the chart and examined the patient. I performed a substantive portion of this encounter, including complete performance of at least one of the key components, in conjunction with the APP. I agree with the Advanced Practitioner's note, impression and recommendations.   For EGD/colon today Neg CT AP 06/2024. Previous CT did show questionable gastric wall thickening versus underdistention.   Anselm Bring, MD [facility] [phone]

## 2024-09-10 NOTE — Op Note (Signed)
 [facility] Patient Name: Darrell Erickson Procedure Date: 09/10/2024 1:55 PM MRN: [id number] Endoscopist: Lynnie Bring , MD, [id number] Age: 33 Referring MD:  Date of Birth: 06-Oct-1991 Gender: Male Account #: [id number] Procedure:                Colonoscopy Indications:              Rectal bleeding. Alternating constipation and                            diarrhea. Negative CT Abdo/pelvis 06/2024 Medicines:                Monitored Anesthesia Care Procedure:                Pre-Anesthesia Assessment:                           - Prior to the procedure, a History and Physical                            was performed, and patient medications and                            allergies were reviewed. The patient's tolerance of                            previous anesthesia was also reviewed. The risks                            and benefits of the procedure and the sedation                            options and risks were discussed with the patient.                            All questions were answered, and informed consent                            was obtained. Prior Anticoagulants: The patient has                            taken no anticoagulant or antiplatelet agents. ASA                            Grade Assessment: II - A patient with mild systemic                            disease. After reviewing the risks and benefits,                            the patient was deemed in satisfactory condition to                            undergo the procedure.  After obtaining informed consent, the colonoscope                            was passed under direct vision. Throughout the                            procedure, the patient's blood pressure, pulse, and                            oxygen saturations were monitored continuously. The                            Olympus Scope [id number] [id number] was introduced through                            the anus and  advanced to the 4 cm into the ileum.                            The colonoscopy was performed without difficulty.                            The patient tolerated the procedure well. The                            quality of the bowel preparation was adequate to                            identify polyps. The terminal ileum, ileocecal                            valve, appendiceal orifice, and rectum were                            photographed. Scope In: 2:29:34 PM Scope Out: 2:47:40 PM Scope Withdrawal Time: 0 hours 15 minutes 49 seconds  Total Procedure Duration: 0 hours 18 minutes 6 seconds  Findings:                 A 12 mm polyp was found in the mid rectum. The                            polyp was pedunculated. The polyp was removed with                            a hot snare. Resection and retrieval were complete.                           Two sessile polyps were found in the mid sigmoid                            colon and proximal ascending colon. The polyps were                            4  to 6 mm in size. These polyps were removed with a                            cold snare. Resection and retrieval were complete.                           Non-bleeding internal hemorrhoids were found during                            retroflexion. The hemorrhoids were small and Grade                            I (internal hemorrhoids that do not prolapse).                           The terminal ileum appeared normal.                           Retroflexion in the right colon was performed.                           The exam was otherwise without abnormality on                            direct and retroflexion views. Complications:            No immediate complications. Estimated Blood Loss:     Estimated blood loss: none. Impression:               - One 12 mm polyp in the mid rectum, removed with a                            hot snare. Resected and retrieved.                           - Two 4  to 6 mm polyps in the mid sigmoid colon and                            in the proximal ascending colon, removed with a                            cold snare. Resected and retrieved.                           - Non-bleeding internal hemorrhoids.                           - The examined portion of the ileum was normal.                           - The examination was otherwise normal on direct                            and retroflexion views. Recommendation:           -  Patient has a contact number available for                            emergencies. The signs and symptoms of potential                            delayed complications were discussed with the                            patient. Return to normal activities tomorrow.                            Written discharge instructions were provided to the                            patient.                           - Resume previous diet.                           - Continue present medications.                           - Await pathology results.                           - No aspirin, ibuprofen, naproxen, or other                            non-steroidal anti-inflammatory drugs for 5 days                            after polyp removal.                           - I do believe he has underlying IBS. Please                            continue MiraLAX 17 g p.o. daily when constipated                            for now.                           - Repeat colonoscopy for surveillance based on                            pathology results.                           - The findings and recommendations were discussed                            with the patient's family. Lynnie Bring, MD 09/10/2024 2:54:49 PM This report has been signed electronically.

## 2024-09-10 NOTE — Patient Instructions (Signed)
 Resume previous diet.  Await pathology results.   Continue present medications and Protonix  40mg /day.   No Asprin, ibuprofen, naproxen, or non-steroidal anti-inflammatory drugs for 5 days.   Continue Miralax 17g daily when constipated.   YOU HAD AN ENDOSCOPIC PROCEDURE TODAY AT THE [facility]:   Refer to the procedure report that was given to you for any specific questions about what was found during the examination.  If the procedure report does not answer your questions, please call your gastroenterologist to clarify.  If you requested that your care partner not be given the details of your procedure findings, then the procedure report has been included in a sealed envelope for you to review at your convenience later.  YOU SHOULD EXPECT: Some feelings of bloating in the abdomen. Passage of more gas than usual.  Walking can help get rid of the air that was put into your GI tract during the procedure and reduce the bloating. If you had a lower endoscopy (such as a colonoscopy or flexible sigmoidoscopy) you may notice spotting of blood in your stool or on the toilet paper. If you underwent a bowel prep for your procedure, you may not have a normal bowel movement for a few days.  Please Note:  You might notice some irritation and congestion in your nose or some drainage.  This is from the oxygen used during your procedure.  There is no need for concern and it should clear up in a day or so.  SYMPTOMS TO REPORT IMMEDIATELY:  Following lower endoscopy (colonoscopy or flexible sigmoidoscopy):  Excessive amounts of blood in the stool  Significant tenderness or worsening of abdominal pains  Swelling of the abdomen that is new, acute  Fever of 100F or higher  Following upper endoscopy (EGD)  Vomiting of blood or coffee ground material  New chest pain or pain under the shoulder blades  Painful or persistently difficult swallowing  New shortness of breath  Fever of 100F or  higher  Black, tarry-looking stools  For urgent or emergent issues, a gastroenterologist can be reached at any hour by calling [phone]. Do not use MyChart messaging for urgent concerns.    DIET:  We do recommend a small meal at first, but then you may proceed to your regular diet.  Drink plenty of fluids but you should avoid alcoholic beverages for 24 hours.  ACTIVITY:  You should plan to take it easy for the rest of today and you should NOT DRIVE or use heavy machinery until tomorrow (because of the sedation medicines used during the test).    FOLLOW UP: Our staff will call the number listed on your records the next business day following your procedure.  We will call around 7:15- 8:00 am to check on you and address any questions or concerns that you may have regarding the information given to you following your procedure. If we do not reach you, we will leave a message.     If any biopsies were taken you will be contacted by phone or by letter within the next 1-3 weeks.  Please call us  at [phone] if you have not heard about the biopsies in 3 weeks.    SIGNATURES/CONFIDENTIALITY: You and/or your care partner have signed paperwork which will be entered into your electronic medical record.  These signatures attest to the fact that that the information above on your After Visit Summary has been reviewed and is understood.  Full responsibility of the confidentiality of this discharge  information lies with you and/or your care-partner.

## 2024-09-10 NOTE — Op Note (Signed)
 [facility] Patient Name: Darrell Erickson Procedure Date: 09/10/2024 2:00 PM MRN: [id number] Endoscopist: Lynnie Bring , MD, [id number] Age: 33 Referring MD:  Date of Birth: 1991/11/03 Gender: Male Account #: [id number] Procedure:                Upper GI endoscopy Indications:              Epigastric abdominal pain with occasional                            nausea/vomiting. Medicines:                Monitored Anesthesia Care Procedure:                Pre-Anesthesia Assessment:                           - Prior to the procedure, a History and Physical                            was performed, and patient medications and                            allergies were reviewed. The patient's tolerance of                            previous anesthesia was also reviewed. The risks                            and benefits of the procedure and the sedation                            options and risks were discussed with the patient.                            All questions were answered, and informed consent                            was obtained. Prior Anticoagulants: The patient has                            taken no anticoagulant or antiplatelet agents. ASA                            Grade Assessment: II - A patient with mild systemic                            disease. After reviewing the risks and benefits,                            the patient was deemed in satisfactory condition to                            undergo the procedure.  After obtaining informed consent, the endoscope was                            passed under direct vision. Throughout the                            procedure, the patient's blood pressure, pulse, and                            oxygen saturations were monitored continuously. The                            GIF [id number] #[id number] was introduced through the                            mouth, and advanced to the second part of  duodenum.                            The upper GI endoscopy was accomplished without                            difficulty. The patient tolerated the procedure                            well. Scope In: Scope Out: Findings:                 The examined esophagus was normal.                           LA Grade A (one or more mucosal breaks less than 5                            mm, not extending between tops of 2 mucosal folds)                            esophagitis with no bleeding was found 40 cm from                            the incisors. Biopsies were taken with a cold                            forceps for histology.                           A non-obstructing wide open Schatzki ring was found                            at the gastroesophageal junction.                           A small hiatal hernia was present.                           Localized mild inflammation characterized  by                            erosions was found in the gastric antrum. Biopsies                            were taken with a cold forceps for histology.                           The examined duodenum was normal. Biopsies for                            histology were taken with a cold forceps for                            evaluation of celiac disease. Complications:            No immediate complications. Estimated Blood Loss:     Estimated blood loss: none. Impression:               - LA Grade A reflux esophagitis with no bleeding.                            Biopsied.                           - Non-obstructing Schatzki ring.                           - Small hiatal hernia.                           - Gastritis. Biopsied.                           - Normal examined duodenum. Biopsied. Recommendation:           - Patient has a contact number available for                            emergencies. The signs and symptoms of potential                            delayed complications were discussed with the                             patient. Return to normal activities tomorrow.                            Written discharge instructions were provided to the                            patient.                           - Resume previous diet.                           -  Continue present medications Protonix  40 mg p.o.                            daily #90, 4RF                           - Await pathology results.                           - Minimize and possibly please stop marijuana                           - The findings and recommendations were discussed                            with the patient's family. Lynnie Bring, MD 09/10/2024 2:50:12 PM This report has been signed electronically.

## 2024-09-10 NOTE — Progress Notes (Signed)
 Pt's states no medical or surgical changes since previsit or office visit.

## 2024-09-11 ENCOUNTER — Telehealth: Payer: Self-pay

## 2024-09-11 NOTE — Telephone Encounter (Signed)
 Left message on follow up call.

## 2024-09-12 ENCOUNTER — Inpatient Hospital Stay: Attending: Family

## 2024-09-12 ENCOUNTER — Inpatient Hospital Stay: Admitting: Family

## 2024-09-16 LAB — SURGICAL PATHOLOGY

## 2024-09-20 ENCOUNTER — Ambulatory Visit: Payer: Self-pay | Admitting: Gastroenterology

## 2024-09-22 ENCOUNTER — Encounter (HOSPITAL_BASED_OUTPATIENT_CLINIC_OR_DEPARTMENT_OTHER): Payer: Self-pay

## 2024-09-23 NOTE — Assessment & Plan Note (Deleted)
 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 For many patients, symptoms are self-limited and respond to conservative management. Initial strategies include NSAIDs, scrotal support, ice packs, and/or warm sitz baths. Identifying exacerbating triggers, modification of activities and/or premedication can help reduce episodes and severity. For chronic syndromes, we may consider pelvic floor physical therapy or referral to a chronic pain specialist. In select cases, we may consider surgical treatment with anesthetic cord blocks +/- spermatic cord denervation.    - Unfortunately NSAIDs contraindicated due to upper GI issues/gastritis -Recommend close attention to inciting triggers and premedication before sexual activity -Incorporate Tylenol, ice packs, supportive underwear and possible sitz baths

## 2024-09-23 NOTE — Progress Notes (Deleted)
   09/26/2024 8:33 AM   Darrell Erickson 1991/01/19 [id number]  Reason for visit: Follow up scrotal pain   HPI: 33 y.o. male, follow up with me today  Prior HPI: Initial follow up with me today, previously followed by Dr. Roseann Gab on Uroxatral  for LUTS in June 2025   Patient has several concerns across multiple mental health domains today History of chronic LUTS, overactive bladder, dysfunctional voiding, subjective incomplete emptying, chronic epididymitis, chronic scrotal pain, constipation   Previously followed by Dr. Roseann, last seen in June 2025 - Left epididymitis, LUTS, left scrotal pain   - prior 10d course of Doxy for left epididymitis IPSS = 25/5. PVR = 1 mL   Scrotal ultrasound from 03/05/2024 showed normal testes bilaterally and a left varicocele     Physical Exam: There were no vitals taken for this visit.   Constitutional:  Alert and oriented, No acute distress. GU: ***  Laboratory Data: N/A  Pertinent Imaging: N/A    Assessment & Plan:    Epididymitis, left Assessment & Plan: Chronic bilateral epididymitis and scrotal pain Left > right Hx of Left varicocele (via US ) UA today negative for infection  We reviewed the common causes of scrotal pain and the basic tenets of management. For many patients, symptoms are self-limited and respond to conservative management. Initial strategies include NSAIDs, scrotal support, ice packs, and/or warm sitz baths. Identifying exacerbating triggers, modification of activities and/or premedication can help reduce episodes and severity. For chronic syndromes, we may consider pelvic floor physical therapy or referral to a chronic pain specialist. In select cases, we may consider surgical treatment with anesthetic cord blocks +/- spermatic cord denervation.    - Unfortunately NSAIDs contraindicated due to upper GI issues/gastritis -Recommend close attention to inciting triggers and premedication before sexual  activity -Incorporate Tylenol, ice packs, supportive underwear and possible sitz baths        [name], MD  [facility] [address], [address] [place], [place] [address] [phone]

## 2024-09-24 ENCOUNTER — Inpatient Hospital Stay: Admitting: Family

## 2024-09-25 ENCOUNTER — Ambulatory Visit (HOSPITAL_BASED_OUTPATIENT_CLINIC_OR_DEPARTMENT_OTHER): Admitting: Cardiology

## 2024-09-26 ENCOUNTER — Ambulatory Visit: Admitting: Urology

## 2024-09-26 DIAGNOSIS — N451 Epididymitis: Secondary | ICD-10-CM

## 2024-09-30 ENCOUNTER — Encounter: Payer: Self-pay | Admitting: Urology

## 2024-10-01 ENCOUNTER — Inpatient Hospital Stay: Attending: Family

## 2024-10-01 ENCOUNTER — Inpatient Hospital Stay: Admitting: Family

## 2024-11-04 NOTE — Progress Notes (Deleted)
   11/06/2024 4:28 PM   Ruston CHRISTELLA Lesches [birth year] [id number]  Reason for visit: Follow up epididymitis   HPI: 33 y.o. male, initial follow up with me today, previously seen by Dr. Roseann in June 2025  Prior HPI: Hx of chronic epididymitis  - last seen in June 2025- Left epididymitis + LUTS  - Scrotal US  (Apr 2025) - left varicocele only  - treated with 10d doxycyline    Physical Exam: There were no vitals taken for this visit.   Constitutional:  Alert and oriented, No acute distress. GU: ***  Laboratory Data: ***  Pertinent Imaging: N/A    Assessment & Plan:    There are no diagnoses linked to this encounter.     [name], MD  [facility] [address] [place], [place] [address] [phone]

## 2024-11-06 ENCOUNTER — Ambulatory Visit: Admitting: Urology

## 2024-11-06 NOTE — Assessment & Plan Note (Deleted)
 Chronic bilateral epididymitis and scrotal pain Left > right  We reviewed the common causes of scrotal pain and the basic tenets of management. For many patients, symptoms are self-limited and respond to conservative management. Initial strategies include NSAIDs, scrotal support, ice packs, and/or warm sitz baths. Identifying exacerbating triggers, modification of activities and/or premedication can help reduce episodes and severity. For chronic syndromes, we may consider pelvic floor physical therapy or referral to a chronic pain specialist. In select cases, we may consider surgical treatment with anesthetic cord blocks +/- spermatic cord denervation.    - Unfortunately NSAIDs contraindicated due to upper GI issues/gastritis -Recommend close attention to inciting triggers and premedication before sexual activity -Incorporate Tylenol, ice packs, supportive underwear and possible sitz baths

## 2024-11-06 NOTE — Progress Notes (Deleted)
° °  11/10/2024 10:33 AM   Darrell Erickson Feb 26, 1991 [id number]  Reason for visit: Follow up epididymitis   HPI: 33 y.o. male, initial follow up with me today, previously seen by Dr. Roseann in June 2025   Prior HPI: Started on Uroxatral  for LUTS in June 2025   Patient has several concerns across multiple mental health domains today History of chronic LUTS, overactive bladder, dysfunctional voiding, subjective incomplete emptying, chronic epididymitis, chronic scrotal pain, constipation   Previously followed by Dr. Roseann, last seen in June 2025 - Left epididymitis, LUTS, left scrotal pain   - prior 10d course of Doxy for left epididymitis IPSS = 25/5. PVR = 1 mL   Scrotal ultrasound from 03/05/2024 showed normal testes bilaterally and a left varicocele     Physical Exam: There were no vitals taken for this visit.   Constitutional:  Alert and oriented, No acute distress.  Laboratory Data: N/A  Pertinent Imaging: N/A    Assessment & Plan:    Epididymitis, left Assessment & Plan: Chronic bilateral epididymitis and scrotal pain Left > right  We reviewed the common causes of scrotal pain and the basic tenets of management. For many patients, symptoms are self-limited and respond to conservative management. Initial strategies include NSAIDs, scrotal support, ice packs, and/or warm sitz baths. Identifying exacerbating triggers, modification of activities and/or premedication can help reduce episodes and severity. For chronic syndromes, we may consider pelvic floor physical therapy or referral to a chronic pain specialist. In select cases, we may consider surgical treatment with anesthetic cord blocks +/- spermatic cord denervation.    - Unfortunately NSAIDs contraindicated due to upper GI issues/gastritis -Recommend close attention to inciting triggers and premedication before sexual activity -Incorporate Tylenol, ice packs, supportive underwear and possible sitz  baths   Lower urinary tract symptoms (LUTS) Assessment & Plan: Chronic, non-specific LUTS Hx of chronic scrotal pain-chronic inflammatory epididymitis Started on Uroxatral  in June 2025  I think pelvic floor dysfunction may be playing a large role in the spectrum of pelvic organ complaints he is experiencing.  I offered a lot of reassurance today, as I do not think any symptom is a sign of larger or more worrisome pathology.  - Continue Uroxatrol for dysfunctional voiding, possible chronic prostatitis, chronic LUTS - Recommend referral to pelvic PT-I think this may be his most beneficial treatment avenue at this point        Darrell JONELLE Skye, MD  [facility] [address] [place], [place] [address] [phone]

## 2024-11-06 NOTE — Assessment & Plan Note (Deleted)
 Chronic, non-specific LUTS Hx of chronic scrotal pain-chronic inflammatory epididymitis Started on Uroxatral  in June 2025  I think pelvic floor dysfunction may be playing a large role in the spectrum of pelvic organ complaints he is experiencing.  I offered a lot of reassurance today, as I do not think any symptom is a sign of larger or more worrisome pathology.  - Continue Uroxatrol for dysfunctional voiding, possible chronic prostatitis, chronic LUTS - Recommend referral to pelvic PT-I think this may be his most beneficial treatment avenue at this point

## 2024-11-10 ENCOUNTER — Ambulatory Visit: Admitting: Urology

## 2024-11-14 ENCOUNTER — Ambulatory Visit: Admitting: Medical

## 2024-11-18 ENCOUNTER — Ambulatory Visit: Admitting: Medical

## 2024-11-28 NOTE — Assessment & Plan Note (Deleted)
 Chronic, non-specific LUTS Hx of chronic scrotal pain-chronic inflammatory epididymitis Started on Uroxatral  in June 2025  I think pelvic floor dysfunction may be playing a large role in the spectrum of pelvic organ complaints he is experiencing.  I offered a lot of reassurance today, as I do not think any symptom is a sign of larger or more worrisome pathology.  - Continue Uroxatrol for dysfunctional voiding, possible chronic prostatitis, chronic LUTS - Recommend referral to pelvic PT-I think this may be his most beneficial treatment avenue at this point

## 2024-11-28 NOTE — Progress Notes (Deleted)
" ° °  12/03/2024 7:34 AM   Darrell Erickson 24-Mar-1991 [id number]  Reason for visit: Follow up epididymitis   HPI: 34 y.o. male, follow up today, last seen in Sept 2025 Recommended pelvic PT for LUTS + chronic pelvic pain, dysfunctional voiding   Prior HPI: Started on Uroxatral  for LUTS in June 2025   Patient has several concerns across multiple mental health domains today History of chronic LUTS, overactive bladder, dysfunctional voiding, subjective incomplete emptying, chronic epididymitis, chronic scrotal pain, constipation   Previously followed by Dr. Roseann, last seen in June 2025 - Left epididymitis, LUTS, left scrotal pain   - prior 10d course of Doxy for left epididymitis IPSS = 25/5. PVR = 1 mL   Scrotal ultrasound from 03/05/2024 showed normal testes bilaterally and a left varicocele     Physical Exam: There were no vitals taken for this visit.   Constitutional:  Alert and oriented, No acute distress.  Laboratory Data: N/A  Pertinent Imaging: N/A    Assessment & Plan:    Epididymitis, left Assessment & Plan: Chronic bilateral epididymitis and scrotal pain Left > right  We reviewed the common causes of scrotal pain and the basic tenets of management. For many patients, symptoms are self-limited and respond to conservative management. Initial strategies include NSAIDs, scrotal support, ice packs, and/or warm sitz baths. Identifying exacerbating triggers, modification of activities and/or premedication can help reduce episodes and severity. For chronic syndromes, we may consider pelvic floor physical therapy or referral to a chronic pain specialist. In select cases, we may consider surgical treatment with anesthetic cord blocks +/- spermatic cord denervation.    - Unfortunately NSAIDs contraindicated due to upper GI issues/gastritis -Recommend close attention to inciting triggers and premedication before sexual activity -Incorporate Tylenol, ice packs,  supportive underwear and possible sitz baths   Lower urinary tract symptoms (LUTS) Assessment & Plan: Chronic, non-specific LUTS Hx of chronic scrotal pain-chronic inflammatory epididymitis Started on Uroxatral  in June 2025  I think pelvic floor dysfunction may be playing a large role in the spectrum of pelvic organ complaints he is experiencing.  I offered a lot of reassurance today, as I do not think any symptom is a sign of larger or more worrisome pathology.  - Continue Uroxatrol for dysfunctional voiding, possible chronic prostatitis, chronic LUTS - Recommend referral to pelvic PT-I think this may be his most beneficial treatment avenue at this point         Darrell JONELLE Skye, MD  [facility] [address] [place], [place] [address] [phone] "

## 2024-11-28 NOTE — Assessment & Plan Note (Deleted)
 Chronic bilateral epididymitis and scrotal pain Left > right  We reviewed the common causes of scrotal pain and the basic tenets of management. For many patients, symptoms are self-limited and respond to conservative management. Initial strategies include NSAIDs, scrotal support, ice packs, and/or warm sitz baths. Identifying exacerbating triggers, modification of activities and/or premedication can help reduce episodes and severity. For chronic syndromes, we may consider pelvic floor physical therapy or referral to a chronic pain specialist. In select cases, we may consider surgical treatment with anesthetic cord blocks +/- spermatic cord denervation.    - Unfortunately NSAIDs contraindicated due to upper GI issues/gastritis -Recommend close attention to inciting triggers and premedication before sexual activity -Incorporate Tylenol, ice packs, supportive underwear and possible sitz baths

## 2024-12-02 ENCOUNTER — Encounter: Payer: Self-pay | Admitting: Medical

## 2024-12-03 ENCOUNTER — Ambulatory Visit: Admitting: Urology

## 2024-12-03 ENCOUNTER — Encounter: Payer: Self-pay | Admitting: Urology

## 2024-12-03 DIAGNOSIS — N451 Epididymitis: Secondary | ICD-10-CM

## 2024-12-03 DIAGNOSIS — R399 Unspecified symptoms and signs involving the genitourinary system: Secondary | ICD-10-CM

## 2024-12-04 ENCOUNTER — Ambulatory Visit: Payer: Self-pay | Admitting: Medical

## 2024-12-04 ENCOUNTER — Ambulatory Visit: Admitting: Medical

## 2024-12-04 VITALS — BP 118/88 | HR 54 | Temp 97.7°F | Resp 15 | Ht 72.0 in | Wt 143.8 lb

## 2024-12-04 DIAGNOSIS — R1013 Epigastric pain: Secondary | ICD-10-CM

## 2024-12-04 DIAGNOSIS — N401 Enlarged prostate with lower urinary tract symptoms: Secondary | ICD-10-CM

## 2024-12-04 DIAGNOSIS — R39859 Costovertebral (angle) tenderness, unspecified side: Secondary | ICD-10-CM | POA: Diagnosis not present

## 2024-12-04 DIAGNOSIS — R809 Proteinuria, unspecified: Secondary | ICD-10-CM

## 2024-12-04 DIAGNOSIS — R350 Frequency of micturition: Secondary | ICD-10-CM | POA: Diagnosis not present

## 2024-12-04 DIAGNOSIS — R3911 Hesitancy of micturition: Secondary | ICD-10-CM | POA: Diagnosis not present

## 2024-12-04 DIAGNOSIS — R1011 Right upper quadrant pain: Secondary | ICD-10-CM

## 2024-12-04 LAB — COMPREHENSIVE METABOLIC PANEL WITH GFR
ALT: 14 U/L (ref 3–53)
AST: 14 U/L (ref 5–37)
Albumin: 4.7 g/dL (ref 3.5–5.2)
Alkaline Phosphatase: 72 U/L (ref 39–117)
BUN: 8 mg/dL (ref 6–23)
CO2: 31 meq/L (ref 19–32)
Calcium: 9.4 mg/dL (ref 8.4–10.5)
Chloride: 103 meq/L (ref 96–112)
Creatinine, Ser: 1.04 mg/dL (ref 0.40–1.50)
GFR: 94.69 mL/min
Glucose, Bld: 81 mg/dL (ref 70–99)
Potassium: 4.6 meq/L (ref 3.5–5.1)
Sodium: 138 meq/L (ref 135–145)
Total Bilirubin: 0.8 mg/dL (ref 0.2–1.2)
Total Protein: 6.6 g/dL (ref 6.0–8.3)

## 2024-12-04 LAB — CBC WITH DIFFERENTIAL/PLATELET
Basophils Absolute: 0 K/uL (ref 0.0–0.1)
Basophils Relative: 0.6 % (ref 0.0–3.0)
Eosinophils Absolute: 0.2 K/uL (ref 0.0–0.7)
Eosinophils Relative: 3 % (ref 0.0–5.0)
HCT: 45.4 % (ref 39.0–52.0)
Hemoglobin: 15.2 g/dL (ref 13.0–17.0)
Lymphocytes Relative: 38.9 % (ref 12.0–46.0)
Lymphs Abs: 2.3 K/uL (ref 0.7–4.0)
MCHC: 33.4 g/dL (ref 30.0–36.0)
MCV: 87.9 fl (ref 78.0–100.0)
Monocytes Absolute: 0.5 K/uL (ref 0.1–1.0)
Monocytes Relative: 8.3 % (ref 3.0–12.0)
Neutro Abs: 2.9 K/uL (ref 1.4–7.7)
Neutrophils Relative %: 49.2 % (ref 43.0–77.0)
Platelets: 159 K/uL (ref 150.0–400.0)
RBC: 5.16 Mil/uL (ref 4.22–5.81)
RDW: 13.6 % (ref 11.5–15.5)
WBC: 6 K/uL (ref 4.0–10.5)

## 2024-12-04 LAB — POCT URINALYSIS DIPSTICK
Blood, UA: NEGATIVE
Glucose, UA: NEGATIVE
Leukocytes, UA: NEGATIVE
Nitrite, UA: NEGATIVE
Protein, UA: NEGATIVE
Spec Grav, UA: 1.015
Urobilinogen, UA: 0.2 U/dL
pH, UA: 6

## 2024-12-04 NOTE — Progress Notes (Signed)
 "  Subjective:    Patient ID: Darrell Erickson, male    DOB: August 03, 1991, 34 y.o.   MRN: [id number]  HPI  Darrell Erickson is a 34 year old male who presents with concerns about foamy urine and weak urinary stream.  He has had foamy urine and a progressively weakening urinary stream for about 1 year. The urine is persistently foamy even when he is well hydrated though not foamy today with urine sample. The stream feels weak and requires effort to initiate and maintain voiding. A urologist diagnosed an enlarged prostate and prescribed alfuzosin , which he stopped because it caused chest pain.  He has GERD and IBS diagnosed after endoscopy and colonoscopy in October 2025 that also found three precancerous polyps. He takes Protonix  with improved nausea and stomach symptoms and has occasional abdominal cramps.  His father has kidney disease related to lupus and other relatives have chronic kidney disease. He has mild, achy right cva pain but only mild rt upper quadrant abdominal pain.  Prior kidney function tests including creatinine and GFR have been normal. Urinalyses have intermittently shown bilirubin and ketones, and protein was negative on the most recent test.  He drinks about four to five alcoholic drinks on some occasions.    Review of Systems  Constitutional:  Negative for chills, fatigue and fever.  Respiratory:  Negative for chest tightness, shortness of breath and wheezing.   Cardiovascular:  Negative for chest pain and palpitations.  Gastrointestinal:  Positive for abdominal pain. Negative for constipation, diarrhea and vomiting.       Mild reflux and ruq pain  Genitourinary:  Negative for dysuria and frequency.  Musculoskeletal:  Negative for back pain, joint swelling and neck stiffness.  Skin:  Negative for rash.  Neurological:  Negative for dizziness, weakness and numbness.  Hematological:  Negative for adenopathy.  Psychiatric/Behavioral:  Negative for behavioral problems  and dysphoric mood.     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  Tobacco Use   Smoking status: Never    Passive exposure: Never   Smokeless tobacco: Never  Vaping Use   Vaping status: Never Used  Substance and Sexual Activity   Alcohol use: Yes    Alcohol/week: 2.0 standard drinks of alcohol    Types: 2 Cans of beer per week    Comment: occ   Drug use: Yes    Types: Marijuana   Sexual activity: Yes  Other Topics Concern   Not on file  Social History Narrative   Not on file   Social Drivers of Health   Tobacco Use: Low Risk (09/10/2024)   Patient History    Smoking Tobacco Use: Never    Smokeless Tobacco Use: Never    Passive Exposure: Never  Financial Resource Strain: Low Risk (07/24/2024)   Overall Financial Resource Strain (CARDIA)    Difficulty of Paying  Living Expenses: Not very hard  Food Insecurity: No Food Insecurity (07/24/2024)   Epic    Worried About Programme Researcher, Broadcasting/film/video in the Last Year: Never true    Ran Out of Food in the Last Year: Never true  Transportation Needs: No Transportation Needs (07/24/2024)   Epic    Lack of Transportation (Medical): No    Lack of Transportation (Non-Medical): No  Physical Activity: Insufficiently Active (07/24/2024)   Exercise Vital Sign    Days of Exercise per Week: 1 day    Minutes of Exercise per Session: 30 min  Stress: No Stress Concern Present (07/24/2024)   Darrell Erickson of Occupational Health - Occupational Stress Questionnaire     Feeling of Stress: Only a little  Recent Concern: Stress - Stress Concern Present (06/11/2024)   Received from [facility] of Occupational Health - Occupational Stress Questionnaire    Do you feel stress - tense, restless, nervous, or anxious, or unable to sleep at night because your mind is troubled all the time - these days?: To some extent  Social Connections: Socially Isolated (07/24/2024)   Social Connection and Isolation Panel    Frequency of Communication with Friends and Family: Once a week    Frequency of Social Gatherings with Friends and Family: Once a week    Attends Religious Services: Never    Database Administrator or Organizations: No    Attends Engineer, Structural: Not on file    Marital Status: Married  Catering Manager Violence: Not At Risk (08/05/2024)   Received from [facility]   HITS    Over the last 12 months how often did your partner scream or curse at you?: Never    Over the last 12 months how often did your partner threaten you with physical harm?: Never    Over the last 12 months how often did your partner physically hurt you?: Never    Over the last 12 months how often did your partner insult you or talk down to you?: Never  Depression (PHQ2-9): Medium Risk (12/04/2024)   Depression (PHQ2-9)    PHQ-2 Score: 8  Alcohol Screen: Low Risk (07/24/2024)   Alcohol Screen    Last Alcohol Screening Score (AUDIT): 1  Housing: Low Risk (07/24/2024)   Epic    Unable to Pay for Housing in the Last Year: No    Number of Times Moved in the Last Year: 1    Homeless in the Last Year: No  Utilities: Not At Risk (06/11/2024)   Received from [facility]    In the past 12 months has the electric, gas, oil, or water company threatened to shut off services in your home?: No  Health Literacy: Not on file    Past Surgical History:  Procedure Laterality Date   CYSTECTOMY  2006   scalp    Family History  Problem Relation Age of Onset    Cataracts Mother    Cirrhosis Mother        EtOH   Bipolar disorder Mother    Schizophrenia Mother    Glaucoma Father    Heart disease Father        Valve replacement Calcified   Kidney failure Father    Lupus Father    Hypertension Paternal Uncle    Colon cancer Paternal Uncle    Hypertension Paternal Uncle    Colon cancer Paternal Uncle    Diabetes Paternal Grandmother  Hypertension Paternal Grandmother    Premature birth Daughter        50 wks PPROM; emergency c-section NICU for 3 months   Lupus Paternal Uncle    Prostate cancer Other 40 - 49   Stomach cancer Neg Hx    Rectal cancer Neg Hx    Liver cancer Neg Hx    Pancreatic cancer Neg Hx     Allergies[1]  Medications Ordered Prior to Encounter[2]  BP 118/88   Pulse (!) 54   Temp 97.7 F (36.5 C) (Oral)   Resp 15   Ht 6' (1.829 m)   Wt 143 lb 12.8 oz (65.2 kg)   SpO2 98%   BMI 19.50 kg/m        Objective:   Physical Exam  General Mental Status- Alert. General Appearance- Not in acute distress.   Skin General: Color- Normal Color. Moisture- Normal Moisture.  Neck Carotid Arteries- Normal color. Moisture- Normal Moisture. No carotid bruits. No JVD.  Chest and Lung Exam Auscultation: Breath Sounds:-Normal.  Cardiovascular Auscultation:Rythm- Regular. Murmurs & Other Heart Sounds:Auscultation of the heart reveals- No Murmurs.  Abdomen Inspection:-Inspeection Normal. Palpation/Percussion:Note:No mass. Palpation and Percussion of the abdomen reveal- faint rt upper quadrantTender, Non Distended + BS, no rebound or guarding.    Neurologic Cranial Nerve exam:- CN III-XII intact(No nystagmus), symmetric smile.  Strength:- 5/5 equal and symmetric strength both upper and lower extremities.       Assessment & Plan:   Patient Instructions  Benign prostatic hyperplasia with lower urinary tract symptoms Enlarged prostate with weak urinary stream and hesitancy. Alfuzosin  caused chest pain,  suggesting a need for alternative medication. Symptoms indicate structural issue. - Initiate pelvic floor therapy. - Contact urologist to discuss alternative medication due to chest pain with alfuzosin .  Evaluation for proteinuria/bilirubin in urine on urinalysis Intermittent proteinuria and bilirubinuria with recent ketones likely from dehydration. Mild right kidney tenderness. Normal kidney function tests historically. Differential includes liver or gallbladder issues. - Ordered urine culture. - Will repeat urinalysis in two weeks to assess for persistent bilirubinuria. -so far normal kidney function.  Gastroesophageal reflux disease with ruq pain on exam GERD with small hiatal hernia and gastritis. Symptoms managed with Protonix , alleviating nausea. - Continue Protonix . - Ordered liver ultrasound. - Ordered repeat liver enzymes and metabolic panel. - Ordered CBC. -will follow above studies since bilirubin in urine. See if GI cause associated with bilirubin in urine. - on follow up repeat ua in 2-3 weeks. if bilirubin still present consider reacing out to pt GI MD to get opinion.  Follow up in 2-3 weeks   Referring To Provider Information [facility] [address] [place] [address] [phone]       I personally spent a total of 55 minutes in the care of the patient today including counseling and educating, placing orders, documenting clinical information in the EHR, and filling out form related to recently uneumployment. 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 .     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 No issues eating peanuts.   Amoxicillin Hives and Rash   Egg Protein-Containing Drug Products Nausea And Vomiting    Egg Solids, Whole Nausea And Vomiting  [2]  Current Outpatient Medications on File Prior to Visit  Medication Sig Dispense Refill   albuterol  (ACCUNEB ) 0.63 MG/3ML nebulizer solution Take 1 ampule by nebulization every 6 (six) hours as needed for wheezing.     pantoprazole  (PROTONIX ) 40 MG tablet Take 1 tablet (40 mg total) by mouth daily. 90 tablet 4   No current facility-administered medications on file prior to visit.   "

## 2024-12-04 NOTE — Patient Instructions (Addendum)
 Benign prostatic hyperplasia with lower urinary tract symptoms Enlarged prostate with weak urinary stream and hesitancy. Alfuzosin  caused chest pain, suggesting a need for alternative medication. Symptoms indicate structural issue. - Initiate pelvic floor therapy. - Contact urologist to discuss alternative medication due to chest pain with alfuzosin .  Evaluation for proteinuria/bilirubin in urine on urinalysis Intermittent proteinuria and bilirubinuria with recent ketones likely from dehydration. Mild right kidney tenderness. Normal kidney function tests historically. Differential includes liver or gallbladder issues. - Ordered urine culture. - Will repeat urinalysis in two weeks to assess for persistent bilirubinuria. -so far normal kidney function.  Gastroesophageal reflux disease with ruq pain on exam GERD with small hiatal hernia and gastritis. Symptoms managed with Protonix , alleviating nausea. - Continue Protonix . - Ordered liver ultrasound. - Ordered repeat liver enzymes and metabolic panel. - Ordered CBC. -will follow above studies since bilirubin in urine. See if GI cause associated with bilirubin in urine. - on follow up repeat ua in 2-3 weeks. if bilirubin still present consider reacing out to pt GI MD to get opinion.  Follow up in 2-3 weeks   Referring To Provider Information [facility] [address] [place] [address] [phone]

## 2024-12-05 ENCOUNTER — Ambulatory Visit (HOSPITAL_BASED_OUTPATIENT_CLINIC_OR_DEPARTMENT_OTHER): Admission: RE | Admit: 2024-12-05 | Source: Ambulatory Visit

## 2024-12-05 LAB — URINE CULTURE
MICRO NUMBER:: 17443163
Result:: NO GROWTH
SPECIMEN QUALITY:: ADEQUATE

## 2024-12-11 ENCOUNTER — Ambulatory Visit (HOSPITAL_BASED_OUTPATIENT_CLINIC_OR_DEPARTMENT_OTHER)

## 2024-12-12 ENCOUNTER — Ambulatory Visit (HOSPITAL_BASED_OUTPATIENT_CLINIC_OR_DEPARTMENT_OTHER): Admitting: Cardiology

## 2024-12-12 NOTE — Progress Notes (Incomplete)
" °  Cardiology Office Note:  .   Date:  12/12/2024  ID:  Darrell Erickson, DOB Nov 26, 1991, MRN [id number] PCP: Dorina Dallas RIGGERS  [facility] Providers Cardiologist:  Shelda Bruckner, MD {  History of Present Illness: .   Darrell Erickson is a 34 y.o. male with PMH asthma, positive ANA who is here for follow up.   I met him 10/05/2023 for syncope. Note from Dr. Jeannetta noted syncope, history of murmur, palpitations, question Marfan's. Had one day (two episodes) of syncope the evening after donating plasma. Felt lightheaded, recovered rapidly. EMS came, ECG normal. Had another presyncopal episode after donating plasma, went away with elevating legs. Was told he had a murmur as a child. Echo 03/11/24 with EF 55-60%, no valve disease, tricuspid aortic valve, normal aorta dimensions.  Family history: dad had lupus, was on dialysis, had to have valve replaced. No known sudden cardiac death. No aortic aneurysm/dissection that he is aware of.  Today: Has had intermittent chest pain/tightness, has been seen in the ER for this, most recently 08/05/24. Workup has been unremarkable.  ROS: Denies chest pain, shortness of breath at rest or with normal exertion. No PND, orthopnea, LE edema or unexpected weight gain. No palpitations.   Studies Reviewed: SABRA    EKG:       Physical Exam:   VS:  There were no vitals taken for this visit.   Wt Readings from Last 3 Encounters:  12/04/24 143 lb 12.8 oz (65.2 kg)  09/10/24 142 lb (64.4 kg)  08/01/24 142 lb 3.2 oz (64.5 kg)    GEN: Well nourished, well developed in no acute distress HEENT: Normal, moist mucous membranes NECK: No JVD CARDIAC: regular rhythm, normal S1 and S2, no rubs or gallops. Very soft systolic murmur. VASCULAR: Radial and DP pulses 2+ bilaterally. No carotid bruits RESPIRATORY:  Clear to auscultation without rales, wheezing or rhonchi  ABDOMEN: Soft, non-tender, non-distended MUSCULOSKELETAL:  Ambulates independently SKIN:  Warm and dry, no edema NEUROLOGIC:  Alert and oriented x 3. No focal neuro deficits noted. PSYCHIATRIC:  Normal affect    ASSESSMENT AND PLAN: .    Syncope -two episodes in the same day, after donating plasma. Prodrome very consistent with vasovagal event -will get echo given syncope and concern for Marfan's by his rheumatologist  Murmur -very soft murmur  CV risk counseling and prevention -recommend heart healthy/Mediterranean diet, with whole grains, fruits, vegetable, fish, lean meats, nuts, and olive oil. Limit salt. -recommend moderate walking, 3-5 times/week for 30-50 minutes each session. Aim for at least 150 minutes.week. Goal should be pace of 3 miles/hours, or walking 1.5 miles in 30 minutes -recommend avoidance of tobacco products. Avoid excess alcohol. -ASCVD risk score: The ASCVD Risk score (Arnett DK, et al., 2019) failed to calculate for the following reasons:   The 2019 ASCVD risk score is only valid for ages 66 to 69   * - Cholesterol units were assumed    Dispo: as needed  Signed, Shelda Bruckner, MD   Shelda Bruckner, MD, PhD, [facility]  [facility]  [place]  [facility] at [facility] [address], [address] [place], [place] [address] [phone]   "

## 2024-12-13 ENCOUNTER — Ambulatory Visit (HOSPITAL_BASED_OUTPATIENT_CLINIC_OR_DEPARTMENT_OTHER): Attending: Medical

## 2024-12-18 ENCOUNTER — Other Ambulatory Visit: Payer: Self-pay | Admitting: Medical

## 2024-12-18 NOTE — Telephone Encounter (Signed)
 Copied from CRM #[id number]. Topic: Clinical - Prescription Issue >> Dec 18, 2024 12:43 PM Macario HERO wrote: Reason for CRM: Patient spouse called said patient is requesting a rush on his medication: albuterol  (ACCUNEB ) 0.63 MG/3ML nebulizer solution [[id number]]

## 2024-12-19 ENCOUNTER — Encounter: Admitting: Physician Assistant

## 2024-12-19 ENCOUNTER — Telehealth: Payer: Self-pay

## 2024-12-19 ENCOUNTER — Ambulatory Visit: Payer: Self-pay | Admitting: Medical

## 2024-12-19 MED ORDER — ALBUTEROL SULFATE HFA 108 (90 BASE) MCG/ACT IN AERS: 2.0000 | INHALATION_SPRAY | Freq: Four times a day (QID) | RESPIRATORY_TRACT | 0 refills | Status: AC | PRN

## 2024-12-19 NOTE — Telephone Encounter (Signed)
 Called pt and notified him of pcp advice. He says he is pretty sure its asthma as he has had it his whole life. But says he will update us  next week. Also said the form that was filled out was not acceptable due to him signing in PCPs section. I have printed off form. I will fill out what I can and will need pcp signature

## 2024-12-19 NOTE — Telephone Encounter (Signed)
Already took care of it

## 2024-12-19 NOTE — Telephone Encounter (Signed)
 Already handled through other means    Copied from CRM #[id number]. Topic: Clinical - Medication Question >> Dec 19, 2024  1:00 PM Alfonso HERO wrote: Reason for CRM: patients wife calling in regards to message about patients refill.

## 2024-12-19 NOTE — Telephone Encounter (Signed)
 FYI Only or Action Required?: Action required by provider: medication refill request.  Patient was last seen in primary care on 12/04/2024 by Dorina Loving, PA-C.  Called Nurse Triage reporting Shortness of Breath.  Symptoms began yesterday.  Interventions attempted: Nothing.  Symptoms are: unchanged.  Triage Disposition: Call PCP Now (overriding See Physician Within 24 Hours)  Patient/caregiver understands and will follow disposition?: Yes   Reason for Disposition  [1] MILD asthma attack (e.g., no SOB at rest, mild SOB with walking, speaks normally in sentences, mild wheezing) AND [2] lasting > 24 hours on prescribed treatment  Answer Assessment - Initial Assessment Questions Patient states that he started to experience wheezing and mild SOB yesterday that has not improved. He is out of his albuterol  inhaler and called yesterday morning to get it refilled, but has not heard anything back. Office visit advised per triage, but patient is requesting to have medication refilled, would like to have this done today pending weather this weekend.   1. RESPIRATORY STATUS: Describe your breathing? (e.g., wheezing, shortness of breath, unable to speak, severe coughing)      Wheezing, mild SOB  2. ONSET: When did this asthma attack begin?      Yesterday  3. TRIGGER: What do you think triggered this attack? (e.g., URI, exposure to pollen or other allergen, tobacco smoke)      Weather  4. PEAK EXPIRATORY FLOW RATE (PEFR): Do you use a peak flow meter? If Yes, ask: What's the current peak flow? What's your personal best peak flow?      No  5. SEVERITY: How bad is this attack?      Mild  6. ASTHMA MEDICINES:  What treatments have you tried?      None, patient is in need of refill  7. INHALED QUICK-RELIEF TREATMENTS FOR THIS ATTACK: What treatments have you given yourself so far? and How many and how often? If using an inhaler, ask, How many puffs? Note: Routine treatments  are 2 puffs every 4 hours as needed. Rescue treatments are 4 puffs repeated every 20 minutes, up to three times as needed.      None  8. OTHER SYMPTOMS: Do you have any other symptoms? (e.g., chest pain, coughing up yellow sputum, fever, runny nose)     Chest tightness  9. O2 SATURATION MONITOR:  Do you use an oxygen saturation monitor (pulse oximeter) at home? If Yes, What is your reading (oxygen level) today? What is your usual oxygen saturation reading? (e.g., 95%)     No  10. PREGNANCY: Is there any chance you are pregnant? When was your last menstrual period?       NA  Protocols used: Asthma Attack-A-AH  Reason for Triage: Patient stated that he's wheezing and having a hard time breathing

## 2024-12-19 NOTE — Telephone Encounter (Signed)
 This encounter was created in error - please disregard.

## 2024-12-19 NOTE — Progress Notes (Signed)
 Error, duplicate; patient PCP sent in Albuterol  already.

## 2024-12-19 NOTE — Addendum Note (Signed)
 Addended by: DORINA DALLAS DORINA PA-C M on: 12/19/2024 12:56 PM   Modules accepted: Orders

## 2024-12-21 MED ORDER — ALBUTEROL SULFATE 0.63 MG/3ML IN NEBU: 1.0000 | INHALATION_SOLUTION | Freq: Four times a day (QID) | RESPIRATORY_TRACT | 12 refills | Status: AC | PRN

## 2024-12-21 NOTE — Addendum Note (Signed)
 Addended by: DORINA DALLAS DORINA PA-C M on: 12/21/2024 08:39 AM   Modules accepted: Orders

## 2024-12-22 ENCOUNTER — Ambulatory Visit (HOSPITAL_BASED_OUTPATIENT_CLINIC_OR_DEPARTMENT_OTHER): Admission: RE | Admit: 2024-12-22 | Source: Ambulatory Visit

## 2024-12-31 ENCOUNTER — Ambulatory Visit (HOSPITAL_BASED_OUTPATIENT_CLINIC_OR_DEPARTMENT_OTHER): Admission: RE | Admit: 2024-12-31 | Source: Ambulatory Visit
